# Patient Record
Sex: Female | Born: 1940
Health system: Southern US, Community
[De-identification: ages and names within clinical notes are randomized; demographics above are authoritative.]

## PROBLEM LIST (undated history)

## (undated) DIAGNOSIS — E785 Hyperlipidemia, unspecified: Secondary | ICD-10-CM

## (undated) DIAGNOSIS — Z9889 Other specified postprocedural states: Secondary | ICD-10-CM

## (undated) DIAGNOSIS — I10 Essential (primary) hypertension: Secondary | ICD-10-CM

## (undated) DIAGNOSIS — M1990 Unspecified osteoarthritis, unspecified site: Secondary | ICD-10-CM

## (undated) DIAGNOSIS — R112 Nausea with vomiting, unspecified: Secondary | ICD-10-CM

## (undated) HISTORY — PX: APPENDECTOMY: SHX54

## (undated) HISTORY — PX: TONSILLECTOMY: SUR1361

## (undated) HISTORY — PX: ABDOMINAL HYSTERECTOMY: SHX81

## (undated) HISTORY — PX: REPLACEMENT TOTAL KNEE: SUR1224

---

## 1999-07-05 ENCOUNTER — Encounter: Admission: RE | Admit: 1999-07-05 | Discharge: 1999-07-05 | Payer: Self-pay | Admitting: Family Medicine

## 1999-07-05 ENCOUNTER — Encounter: Payer: Self-pay | Admitting: Family Medicine

## 2000-07-09 ENCOUNTER — Encounter: Payer: Self-pay | Admitting: Internal Medicine

## 2000-07-09 ENCOUNTER — Encounter: Admission: RE | Admit: 2000-07-09 | Discharge: 2000-07-09 | Payer: Self-pay | Admitting: Internal Medicine

## 2001-04-29 ENCOUNTER — Encounter: Admission: RE | Admit: 2001-04-29 | Discharge: 2001-04-29 | Payer: Self-pay | Admitting: Family Medicine

## 2001-04-29 ENCOUNTER — Encounter: Payer: Self-pay | Admitting: Family Medicine

## 2001-07-11 ENCOUNTER — Encounter: Admission: RE | Admit: 2001-07-11 | Discharge: 2001-07-11 | Payer: Self-pay | Admitting: Internal Medicine

## 2001-07-11 ENCOUNTER — Encounter: Payer: Self-pay | Admitting: Internal Medicine

## 2003-10-25 ENCOUNTER — Encounter: Admission: RE | Admit: 2003-10-25 | Discharge: 2003-10-25 | Payer: Self-pay | Admitting: Family Medicine

## 2004-06-07 ENCOUNTER — Ambulatory Visit (HOSPITAL_COMMUNITY): Admission: RE | Admit: 2004-06-07 | Discharge: 2004-06-07 | Payer: Self-pay | Admitting: Family Medicine

## 2005-09-11 ENCOUNTER — Other Ambulatory Visit: Admission: RE | Admit: 2005-09-11 | Discharge: 2005-09-11 | Payer: Self-pay | Admitting: Family Medicine

## 2007-11-17 ENCOUNTER — Observation Stay (HOSPITAL_COMMUNITY): Admission: EM | Admit: 2007-11-17 | Discharge: 2007-11-18 | Payer: Self-pay | Admitting: Emergency Medicine

## 2010-06-25 ENCOUNTER — Encounter: Admission: RE | Admit: 2010-06-25 | Discharge: 2010-06-25 | Payer: Self-pay | Admitting: Orthopedic Surgery

## 2010-07-12 ENCOUNTER — Ambulatory Visit (HOSPITAL_BASED_OUTPATIENT_CLINIC_OR_DEPARTMENT_OTHER): Admission: RE | Admit: 2010-07-12 | Discharge: 2010-07-12 | Payer: Self-pay | Admitting: Orthopedic Surgery

## 2010-09-16 ENCOUNTER — Encounter: Payer: Self-pay | Admitting: Orthopedic Surgery

## 2010-11-07 LAB — BASIC METABOLIC PANEL
CO2: 35 mEq/L — ABNORMAL HIGH (ref 19–32)
Calcium: 9.4 mg/dL (ref 8.4–10.5)
Chloride: 104 mEq/L (ref 96–112)
Creatinine, Ser: 0.9 mg/dL (ref 0.4–1.2)
GFR calc Af Amer: >60 mL/min (ref >60)
Sodium: 146 mEq/L — ABNORMAL HIGH (ref 135–145)

## 2010-11-07 LAB — POCT HEMOGLOBIN-HEMACUE: Hemoglobin: 15 g/dL (ref 12.0–15.0)

## 2011-01-09 NOTE — Consult Note (Signed)
Diamond White, SAUR NO.:  [id number]   MEDICAL RECORD NO.:  [id number]          PATIENT TYPE:  OBV   LOCATION:  3701                         FACILITY:  [facility]   PHYSICIAN:  Jake Bathe, MD      DATE OF BIRTH:  [birth year]   DATE OF CONSULTATION:  11/17/2007  DATE OF DISCHARGE:                                 CONSULTATION   REFERRING PHYSICIAN:  Kela Millin, M.D.   PRIMARY PHYSICIAN:  Tally Joe, M.D.   REASON FOR CONSULTATION:  Ms. Cydni Reddoch is being seen at the  request of Dr. Suanne Marker for the evaluation of chest pain.   HISTORY OF PRESENT ILLNESS:  A 70 year old female with hypertension,  hyperlipidemia, obesity, who yesterday awoke at 2 a.m. with hurting in  her chest, left breast area with sometimes radiation to the left  shoulder.  This occurred while she got up to go to the bathroom.  As of  note, earlier that day she had done some heavy lifting moving some file  work around.  She took Tylenol, Tums, went back to sleep, sat up in the  chair for while.  When trying to get out of the chair while straining,  her chest pain hurt worse.  It was worse with deep inspiration or cough.  She came here to [facility] following a visit by  Dr. Tally Joe and chest pain had no change with nitroglycerin or  morphine.  She has no recent cough, fevers, shortness of breath, nausea,  vomiting, or breathing problems.   PAST MEDICAL HISTORY:  1. Hypertension.  2. Hyperlipidemia, on Crestor.  3. Hiatal hernia.  4. Left hip surgery, bursitis.  5. Left shoulder surgery.   ALLERGIES:  COMPAZINE caused swelling.   MEDICATIONS:  1. Hydrochlorothiazide 25 mg once a day.  2. Crestor 10 mg once a day.  3. Fish oil.  4. Multivitamin.   She does not aspirin take secondary to easy bruising.   FAMILY HISTORY:  No early family history of coronary artery disease or  sudden death.   SOCIAL HISTORY:  No tobacco use.  No alcohol use.  No  illicit drug use.  She works at a Nurse, adult home.   REVIEW OF SYSTEMS:  Unless explained above, all other 12 review of  systems negative.   PHYSICAL EXAMINATION:  VITAL SIGNS:  Pulse 72, blood pressure 114/56,  respirations 16, and satting 100% on room air.  GENERAL:  Alert and oriented x3, in no acute distress, sitting  comfortably in bed, was talking on telephone earlier.  HEENT:  Eyes:  Well-perfused conjunctiva.  EOMI.  No scleral icterus.  NECK:  Supple.  No lymphadenopathy.  No thyromegaly.  No JVD.  No  carotid bruits.  CARDIOVASCULAR:  Regular rate and rhythm.  No murmurs, rubs, or gallops.  Normal PMI.  LUNGS:  Clear to auscultation bilaterally.  Normal respiratory effort.  ABDOMEN:  Soft and nontender.  Normoactive bowel sounds.  Mild obesity.  No bruits.  EXTREMITIES:  No clubbing, cyanosis, or edema.  Normal distal pulses.  NEUROLOGIC:  Nonfocal.  No  tremors.  SKIN:  Warm, dry, and intact.  No rashes over chest wall.  MUSCULOSKELETAL:  She did have tenderness to palpation over the area of  concerning chest pain.   DATA:  EKG done at Dr. Merita Norton office shows sinus rhythm with  nonspecific ST-T wave changes with subtle less then 1-mm ST depression  diffusely in II, III, F, V2, V3, V4, and V5.  Original EKG here shows  sinus rhythm with nonspecific ST-T wave changes, but no significant ST  depression.  T waves are more upright in lead II and in lead V3.  QTc  was 490 on this EKG.  Prior ECG was 429.  EKGs are 2 hours apart.  First  set of cardiac biomarkers are negative.  D-dimer negative.  Chest x-ray  shows no acute airspace disease with questionable left lower lobe  atelectasis.  Sodium 141, potassium 3.6, creatinine 1.1, and glucose  104.  White blood cell count 7.0, hemoglobin 13.9, hematocrit 41, and  platelet count 162,000.  Chest x-ray was personally viewed.   ASSESSMENT/PLAN:  A 70 year old-female with hypertension,  hyperlipidemia, obesity with chest  pain.  1. Chest pain - given changes with position and recent heavy lifting      likely musculoskeletal in etiology, however given her multitude of      risk factors, we agree  with Dr. Donna Bernard and will watch overnight,      continue cycle cardiac enzymes, and get serial EKGs.  If cardiac      enzymes or biomarkers are normal, we will proceed with stress      testing for further risk stratification.  We would recommend      aspirin and low-dose beta blocker currently.  EKG changes are      subtle and nonspecific, however diffuse ST depression is sometimes      associated with severe coronary artery disease and that is why this      warrants further investigation.  D-dimer is negative.  No shortness      of breath, doubt PE.  2. Hyperlipidemia - continue Crestor and fish oil.  3. Hypertension - continue hydrochlorothiazide, add low-dose beta      blocker.  4. Obesity - encouraged weight loss.   We will follow along with you.      Jake Bathe, MD  Electronically Signed     MCS/MEDQ  D:  11/17/2007  T:  11/18/2007  Job:  [id number]   cc:   Tally Joe, M.D.  Kela Millin, M.D.

## 2011-01-09 NOTE — H&P (Signed)
NAMECARLEI, HUANG             ACCOUNT NO.:  [id number]   MEDICAL RECORD NO.:  [id number]          PATIENT TYPE:  OBV   LOCATION:  1824                         FACILITY:  [facility]   PHYSICIAN:  Kela Millin, M.D.DATE OF BIRTH:  05/19/1941   DATE OF ADMISSION:  11/17/2007  DATE OF DISCHARGE:                              HISTORY & PHYSICAL   PRIMARY CARE PHYSICIAN:  Tally Joe, M.D.   CHIEF COMPLAINT:  Chest pain.   HISTORY OF PRESENT ILLNESS:  The patient is a 70 year old obese white  female with past medical history significant for hypertension,  hyperlipidemia, who presents with the above complaints.  She states that  she woke up at 2:00 a.m. to go to the bathroom and noticed she was  having some chest pain.  She describes the pain as left precordial in  location, constant, 9/10 in intensity at its worst, but improved  slightly to 7/10 in intensity, at the time she was seen in the ER.  No  associated shortness of breath, diaphoresis, also no nausea or vomiting  associated.  She states that the pain is worse with lying flat or  leaning forward.  It is also worse with inspiration.  She admits to a  non-productive cough that she states is longstanding, and she attributes  to reflux.  She admits to lifting heavy weights on the day prior to her  symptom onset.  She denies fevers, hemoptysis, dysuria, diarrhea,  melena, no hematochezia.  She also denies dizziness and no palpitations.   The patient was seen in the ER, and her initial EKG showed diffuse, less  than 1-mm ST depression in the inferior lateral leads, and a repeat EKG  showed an improvement in those changes, but point of care markers were  negative.  A chest x-ray was done which showed ? atelectasis at the left  base versus peripheral air space disease.  The pain was not relieved by  nitroglycerin or morphine in the ER.  Her D-dimer within normal limits  at 0.31.  She is admitted for further evaluation and  management.   PAST MEDICAL HISTORY:  As above.   MEDICATIONS:  She thinks she is on hydrochlorothiazide 25 mg.  She is  also on Crestor, 10 mg daily and fish oil and a multivitamin.   ALLERGIES:  DOXYCYCLINE AND COMPAZINE.   SOCIAL HISTORY:  She denies tobacco (second hand smoke from her  husband), she denies alcohol.   FAMILY HISTORY:  Her father is deceased at age 60, he had congestive  heart failure.   REVIEW OF SYSTEMS:  As per HPI, other review of systems negative.   PHYSICAL EXAM:  GENERAL:  In general, the patient is an obese, elderly  white female in no apparent distress.  VITAL SIGNS:  Her temperature is 98.1, blood pressure 104/57, initially  112/53, pulse is 72 with a respiratory rate of 17, O2 sat of 97%.  HEENT:  PERRL, EOMI, sclerae anicteric, moist mucous membranes and no  oral exudates.  NECK:  Supple, no adenopathy, no thyromegaly.  No JVD.  LUNGS:  Decreased breath sounds at the bases,  otherwise clear to  auscultation.  CARDIOVASCULAR:  Regular rate and rhythm, normal S1-S2, she has chest  wall tenderness on the left.  ABDOMEN:  Soft, bowel sounds present, nontender, nondistended.  No  organomegaly and no masses palpable.  EXTREMITIES:  No cyanosis and no edema.   LABORATORY DATA:  As per HPI, also her white cell count is 7.0 with a  hemoglobin of 13.9, hematocrit of 41, platelet count of 162, neutrophil  count 69%, sodium is 141, potassium 3.6, chloride 102, BUN of 17,  creatinine is 1.1, glucose is 104, and urinalysis is negative for  infection.  Her D-dimer is 0.31.  Her chest x-ray shows left basilar  atelectasis versus peripheral air space disease.   ASSESSMENT AND PLAN:  Problem:  1. Chest pain - More likely musculoskeletal versus probable pneumonia,      but given her multiple risk factors as well as the abnormal EKG,      will admit to rule out a myocardial infarction.  I have also      consulted cardiology for further recommendations.  Will  obtain      serial cardiac enzymes and repeat EKG in the a.m.  Will place the      patient on aspirin, beta blockers, nitroglycerin p.r.n.  Will also      add a PPI to cover for possible GI etiology and p.r.n. Toradol to      cover for possible musculoskeletal.  I started the patient on      empiric antibiotics with possible pneumonia.  2. Probable left lower lobe pneumonia - As discussed above, empiric      antibiotics and follow.  3. ? Gastroesophageal reflux disease - PPI as above and follow.  4. Hypertension - Monitor and treat as appropriate, follow and resume      outpatient medications.  5. History of hyperlipidemia - Continue outpatient medications.      Kela Millin, M.D.  Electronically Signed     ACV/MEDQ  D:  11/17/2007  T:  11/17/2007  Job:  [id number]   cc:   Tally Joe, M.D.

## 2011-01-17 ENCOUNTER — Other Ambulatory Visit: Payer: Self-pay | Admitting: Orthopedic Surgery

## 2011-01-17 DIAGNOSIS — M25562 Pain in left knee: Secondary | ICD-10-CM

## 2011-01-24 ENCOUNTER — Other Ambulatory Visit: Payer: Self-pay

## 2011-01-27 ENCOUNTER — Ambulatory Visit
Admission: RE | Admit: 2011-01-27 | Discharge: 2011-01-27 | Disposition: A | Discharge status: Home / Self Care | Payer: No Typology Code available for payment source | Source: Ambulatory Visit | Attending: Orthopedic Surgery | Admitting: Orthopedic Surgery

## 2011-01-27 DIAGNOSIS — M25562 Pain in left knee: Secondary | ICD-10-CM

## 2011-03-19 ENCOUNTER — Other Ambulatory Visit: Payer: Self-pay | Admitting: Orthopedic Surgery

## 2011-03-19 DIAGNOSIS — M545 Low back pain: Secondary | ICD-10-CM

## 2011-03-22 ENCOUNTER — Ambulatory Visit
Admission: RE | Admit: 2011-03-22 | Discharge: 2011-03-22 | Disposition: A | Discharge status: Home / Self Care | Payer: Medicare Other | Source: Ambulatory Visit | Attending: Orthopedic Surgery | Admitting: Orthopedic Surgery

## 2011-03-22 DIAGNOSIS — M545 Low back pain: Secondary | ICD-10-CM

## 2011-04-19 ENCOUNTER — Ambulatory Visit (HOSPITAL_COMMUNITY)
Admission: RE | Admit: 2011-04-19 | Discharge: 2011-04-19 | Disposition: A | Discharge status: Home / Self Care | Payer: Medicare Other | Source: Ambulatory Visit | Attending: Orthopedic Surgery | Admitting: Orthopedic Surgery

## 2011-04-19 ENCOUNTER — Encounter (HOSPITAL_COMMUNITY)
Admission: RE | Admit: 2011-04-19 | Discharge: 2011-04-19 | Disposition: A | Discharge status: Home / Self Care | Payer: Medicare Other | Source: Ambulatory Visit | Attending: Orthopedic Surgery | Admitting: Orthopedic Surgery

## 2011-04-19 ENCOUNTER — Other Ambulatory Visit (HOSPITAL_COMMUNITY): Payer: Self-pay | Admitting: Orthopedic Surgery

## 2011-04-19 DIAGNOSIS — I10 Essential (primary) hypertension: Secondary | ICD-10-CM | POA: Insufficient documentation

## 2011-04-19 DIAGNOSIS — Z01811 Encounter for preprocedural respiratory examination: Secondary | ICD-10-CM | POA: Insufficient documentation

## 2011-04-19 DIAGNOSIS — IMO0002 Reserved for concepts with insufficient information to code with codable children: Secondary | ICD-10-CM | POA: Insufficient documentation

## 2011-04-19 DIAGNOSIS — Z01812 Encounter for preprocedural laboratory examination: Secondary | ICD-10-CM | POA: Insufficient documentation

## 2011-04-19 DIAGNOSIS — M1712 Unilateral primary osteoarthritis, left knee: Secondary | ICD-10-CM

## 2011-04-19 DIAGNOSIS — M1710 Unilateral primary osteoarthritis, unspecified knee: Secondary | ICD-10-CM | POA: Insufficient documentation

## 2011-04-19 LAB — URINALYSIS, ROUTINE W REFLEX MICROSCOPIC
Glucose, UA: NEGATIVE mg/dL
Leukocytes, UA: NEGATIVE
Nitrite: NEGATIVE
Specific Gravity, Urine: 1.019 (ref 1.005–1.030)
pH: 5 (ref 5.0–8.0)

## 2011-04-19 LAB — ABO/RH: ABO/RH(D): O POS

## 2011-04-19 LAB — DIFFERENTIAL
Basophils Relative: 0 % (ref 0–1)
Eosinophils Absolute: 0.2 10*3/uL (ref 0.0–0.7)
Monocytes Relative: 9 % (ref 3–12)
Neutro Abs: 5.2 10*3/uL (ref 1.7–7.7)
Neutrophils Relative %: 63 % (ref 43–77)

## 2011-04-19 LAB — COMPREHENSIVE METABOLIC PANEL
ALT: 17 U/L (ref 0–35)
AST: 17 U/L (ref 0–37)
Albumin: 3.5 g/dL (ref 3.5–5.2)
Alkaline Phosphatase: 78 U/L (ref 39–117)
CO2: 31 mEq/L (ref 19–32)
Chloride: 103 mEq/L (ref 96–112)
Creatinine, Ser: 0.74 mg/dL (ref 0.50–1.10)
Potassium: 3.6 mEq/L (ref 3.5–5.1)
Sodium: 143 mEq/L (ref 135–145)
Total Bilirubin: 0.5 mg/dL (ref 0.3–1.2)

## 2011-04-19 LAB — CBC
Hemoglobin: 14.4 g/dL (ref 12.0–15.0)
Platelets: 158 10*3/uL (ref 150–400)
RBC: 4.57 MIL/uL (ref 3.87–5.11)
WBC: 8.3 10*3/uL (ref 4.0–10.5)

## 2011-04-19 LAB — TYPE AND SCREEN: Antibody Screen: NEGATIVE

## 2011-04-19 LAB — PROTIME-INR: INR: 0.9 (ref 0.00–1.49)

## 2011-04-19 LAB — SURGICAL PCR SCREEN: MRSA, PCR: NEGATIVE

## 2011-04-23 ENCOUNTER — Inpatient Hospital Stay (HOSPITAL_COMMUNITY)
Admission: RE | Admit: 2011-04-23 | Discharge: 2011-04-26 | DRG: 470 | Disposition: A | Discharge status: Home Health | Payer: Medicare Other | Source: Ambulatory Visit | Attending: Orthopedic Surgery | Admitting: Orthopedic Surgery

## 2011-04-23 DIAGNOSIS — M1710 Unilateral primary osteoarthritis, unspecified knee: Principal | ICD-10-CM | POA: Diagnosis present

## 2011-04-23 DIAGNOSIS — E785 Hyperlipidemia, unspecified: Secondary | ICD-10-CM | POA: Diagnosis present

## 2011-04-23 DIAGNOSIS — E669 Obesity, unspecified: Secondary | ICD-10-CM | POA: Diagnosis present

## 2011-04-23 DIAGNOSIS — I10 Essential (primary) hypertension: Secondary | ICD-10-CM | POA: Diagnosis present

## 2011-04-24 LAB — URINALYSIS, ROUTINE W REFLEX MICROSCOPIC
Glucose, UA: NEGATIVE mg/dL
Hgb urine dipstick: NEGATIVE
Ketones, ur: NEGATIVE mg/dL
Protein, ur: NEGATIVE mg/dL

## 2011-04-24 LAB — CBC
HCT: 35.8 % — ABNORMAL LOW (ref 36.0–46.0)
MCHC: 32.7 g/dL (ref 30.0–36.0)
Platelets: 126 10*3/uL — ABNORMAL LOW (ref 150–400)
RDW: 13.1 % (ref 11.5–15.5)

## 2011-04-24 LAB — BASIC METABOLIC PANEL
CO2: 30 mEq/L (ref 19–32)
Calcium: 8 mg/dL — ABNORMAL LOW (ref 8.4–10.5)
Creatinine, Ser: 0.69 mg/dL (ref 0.50–1.10)
Glucose, Bld: 127 mg/dL — ABNORMAL HIGH (ref 70–99)

## 2011-04-24 LAB — PROTIME-INR: INR: 1.02 (ref 0.00–1.49)

## 2011-04-25 LAB — CBC
HCT: 33.3 % — ABNORMAL LOW (ref 36.0–46.0)
MCH: 32.2 pg (ref 26.0–34.0)
MCHC: 34.2 g/dL (ref 30.0–36.0)
MCV: 94.1 fL (ref 78.0–100.0)
RDW: 13.1 % (ref 11.5–15.5)

## 2011-04-26 LAB — CBC
HCT: 34.6 % — ABNORMAL LOW (ref 36.0–46.0)
MCH: 31.1 pg (ref 26.0–34.0)
MCHC: 33.2 g/dL (ref 30.0–36.0)
MCV: 93.5 fL (ref 78.0–100.0)
RDW: 13 % (ref 11.5–15.5)

## 2011-05-03 NOTE — Op Note (Addendum)
NAMEMELENA, HAYES NO.:  [id number]  MEDICAL RECORD NO.:  [id number]  LOCATION:  XRAY                         FACILITY:  [facility]  PHYSICIAN:  Harvie Junior, M.D.   DATE OF BIRTH:  [birth year]  DATE OF PROCEDURE:  04/23/2011 DATE OF DISCHARGE:  04/19/2011                              OPERATIVE REPORT   PREOPERATIVE DIAGNOSIS:  End-stage degenerative joint disease, left knee with valgus malalignment.  POSTOPERATIVE DIAGNOSIS:  End-stage degenerative joint disease, left knee with valgus malalignment.  PRINCIPAL PROCEDURE: 1. The left total knee replacement with a Sigma system size 3 femur,     size 2.5 tibia, 10-mm bridging bearing and a 35-mm all-polyethylene     patella 2. Computer-assisted left total knee replacement. 3. Lateral retinacular release.  SURGEON:  Harvie Junior, MD  ASSISTANT:  Marshia Ly, PA.  HISTORY:  Ms. Magnus Ivan is a 70 year old female with long history having had significant complaints of left knee pain.  She had been treated conservatively for prolonged period time, activity modification, use of a cane, arthroscopy injection therapy, viscous supplementation because she failed all of these conservative measures over a period of greater than years.  The patient was also taken to the operating for left total knee replacement.  Because of her valgus malalignment, computer system was chosen to be used preoperatively.  PROCEDURE:  The patient was taken to the operating room.  After adequate anesthesia was obtained under general anesthetic, the patient was placed supine on the operating room table.  The left leg was then prepped and draped in usual sterile fashion.  Following this, the leg was exsanguinated.  Blood pressure tourniquet was inflated to 350 mmHg. Following this, a midline incision was made through subcutaneous tissue down the level of extensor mechanism.  Medial parapatellar arthrotomy was undertaken.  The medial and  lateral meniscus was then removed as well as anterior posterior cruciates, retropatellar fat pad and the synovium in the anterior aspect of the femur.  Attention was then turned towards computer assistance were 2 pins were placed in the tibia, 2 pins in the femur and then the rays were placed in the knee, registration process is undertaken.  This adds about a half an hour to the surgical procedure.  Once this was completed, the tibia was then cut perpendicular to its long axis.  The femur was then cut perpendicular to its anatomic axis and spacer blocks were put in place to get easy perfect neutral long alignment and gap balance.  Attention was turned to the femur, which was sized to a size 3 and anterior-posterior cuts were made as well as chamfers and box.  Attention was turned to the tibia, sized to 2.5, it was drilled and keeled.  Once this was done, the trial components were put in place, 10-mm bridging bearing and computer assistance gave [place] perfect neutral long alignment and gap balance. Attention was turned to the patella, which was drilled to a level of 3 pegs were drilled for 35 patella.  Patellar button was chosen and put through a range of motion.  There was some lateral patellar tracking at this point with patellar pulling to the lateral side.  At that  point, a lateral release was performed with a Bovie and this made the patellar tracking midline at this point.  Once this was completed, the trial components were all removed.  The knee was copious and thoroughly lavaged and suctioned dry, and dried with a sponge.  Final components were cemented into place, size 2.5 tibia, size 3 femur, 10-mm bridging bearing and a 35-mm all-polyethylene patella.  Once this was put into place, the cement was allowed to harden.  Excess cement was removed the cement tool.  Once that was completed, then the tourniquet was let down. All bleeders were controlled electrocautery.  The final  polyethylene size 10 was put in place and the knee put through a range of motion again in perfect patellar tracking.  At this point the knee was copious and thoroughly lavaged, suctioned dry.  I closed with 1 Vicryl running and the medial parapatellar arthrotomy and 2-0 Vicryl and 3-0 Monocryl subcuticular.  Benzoin, Steri-Strips was applied.  Sterile compressive dressing was applied.  The patient was taken to recovery room, was noted to be in satisfactory condition.  Estimated blood loss for this procedure less than 50 mL.     Harvie Junior, M.D.     Ranae Plumber  D:  04/23/2011  T:  04/24/2011  Job:  [id number]  Electronically Signed by Jodi Geralds M.D. on 05/15/2011 12:47:51 PM

## 2011-05-21 LAB — CBC
Hemoglobin: 13.9
RBC: 4.42
RDW: 13.1

## 2011-05-21 LAB — URINE MICROSCOPIC-ADD ON

## 2011-05-21 LAB — POCT CARDIAC MARKERS
Myoglobin, poc: 63.9
Operator id: 151321

## 2011-05-21 LAB — POCT I-STAT, CHEM 8
BUN: 17
Calcium, Ion: 1.14
Chloride: 102
Potassium: 3.6

## 2011-05-21 LAB — DIFFERENTIAL
Basophils Absolute: 0
Lymphocytes Relative: 20
Monocytes Absolute: 0.6
Neutro Abs: 4.8
Neutrophils Relative %: 69

## 2011-05-21 LAB — URINALYSIS, ROUTINE W REFLEX MICROSCOPIC
Glucose, UA: NEGATIVE
Hgb urine dipstick: NEGATIVE
Specific Gravity, Urine: 1.03
pH: 5

## 2011-05-21 LAB — CK TOTAL AND CKMB (NOT AT ARMC): CK, MB: 1.2

## 2011-05-21 LAB — CARDIAC PANEL(CRET KIN+CKTOT+MB+TROPI)
CK, MB: 1.3
Relative Index: 1.2
Total CK: 108

## 2011-05-22 NOTE — Discharge Summary (Signed)
NAMEZARI, CLY             ACCOUNT NO.:  [id number]  MEDICAL RECORD NO.:  [id number]  LOCATION:                                 FACILITY:  PHYSICIAN:  Harvie Junior, M.D.   DATE OF BIRTH:  03-28-1941  DATE OF ADMISSION:  04/23/2011 DATE OF DISCHARGE:  04/26/2011                              DISCHARGE SUMMARY   ADMITTING DIAGNOSES: 1. End-stage degenerative joint disease, left knee. 2. Chronic osteoarthritis. 3. Hypertension. 4. Hyperlipidemia.  DISCHARGE DIAGNOSES: 1. End-stage degenerative joint disease, left knee. 2. Chronic osteoarthritis. 3. Hypertension. 4. Hyperlipidemia.  PROCEDURES IN HOSPITAL:  Left total knee arthroplasty, computer assisted, Jodi Geralds, MD, April 23, 2011.  BRIEF HISTORY:  Diamond White is a 70 year old patient who has been treated in our practice for many years.  She has a long history of left knee pain.  She has pain with ambulation, positive night pain in her left knee.  She had greater than 6 months duration of pain.  She did not get improvement with the use of assisted devices.  She had x-rays that again showed severe bone-on-bone degenerative arthritis and she got no relief with exhaustive conservative treatment including injection therapy, physical therapy, and modification of activity.  Based upon her clinical and radiographic findings, she is felt to be a candidate for a left total knee replacement and is admitted for this.  PERTINENT LABORATORY STUDIES:  Her hemoglobin on admission was 14.0, hematocrit of 43, potassium was 3.6.  On postop day #1, her hemoglobin was 11.7, #2 was 11.4, hemoglobin was 11.5 on postop day #3.  Platelet count was 128,000.  Her protime on postop day #1 was 13.6 seconds with an INR of 1.02.  On the date of discharge on Coumadin therapy per pharmacy, her protime was 36.4 seconds with an INR of 3.59.  BMET was within normal limits with a glucose of 127, potassium of 3.6.  Sodium of 139.  EGFR was  greater than 60.  Urinalysis showed no abnormalities preoperatively.  HOSPITAL COURSE:  The patient underwent left total knee arthroplasty, computer assisted.  It is well described in Dr. Luiz Blare' operative note. Preoperatively, she was given a gram of Ancef and 80 mg IV of gentamicin.  Postoperatively, she was given a gram of Ancef q.8 h. x24 hours.  She was started on Coumadin, antithrombotic therapy per pharmacy protocol for DVT prophylaxis.  PCA morphine pump was used for pain control.  Physical therapy was ordered for walker ambulation, weightbearing as tolerated on the left.  CPM machine was used for knee range of motion.  On postoperative day #1, she had complaints of left knee pain.  She was taking fluids without difficulty.  Her Foley catheter was removed.  Her vital signs were stable.  Her O2 sats were 98% on 2 L of oxygen.  Her hemoglobin was 11.7.  Her BMET was within normal range.  Her INR was 1.07.  Her Foley catheter was removed.  She was continued on the PCA morphine pump.  On postoperative day #2, her left knee drain was removed.  Her dressing was changed.  Her IV was converted to a saline lock and her PCA morphine pump was discontinued.  She continued with physical therapy.  On postop day #3, she had moderate left knee pain.  She stated she was ready to go home.  She was taking fluids and voiding without difficulty.  She was progressing with physical therapy.  She had low-grade fever of 100.0 and was then found to be afebrile.  Her vital signs were stable.  Her O2 sats were 93% on room air.  Hemoglobin was 11.5.  INR was 3.59.  Her left knee dressing was clean and dry and neurovascular status was intact in her left lower extremity.  She had her saline lock discontinued.  She was discharged home after physical therapy.  She will hold her Coumadin until further notified by the Home Health Pharmacy.  She will need home health physical therapy 3 times a week and home CPM  machine for left knee range of motion.  CONDITION ON DISCHARGE:  Improved.  DIET ON DISCHARGE:  Regular.  Medications at discharge will include: 1. Alprazolam 0.25 mg 1.2-1 tablet twice daily as needed. 2. Hydrochlorothiazide 25 mg 1 daily. 3. Calcium OTC 1 daily. 4. Fish oil 1 daily. 5. Crestor 10 mg 1 daily. 6. Vicodin 5 mg 1 or 2 every 4-6 hours as needed for pain. 7. Robaxin 750 mg 1 every 8 hours as needed for spasm. 8. Coumadin as directed per pharmacy x1 month postop.  She will need to keep her left knee wound dry until further notified. She will follow up with Dr. Luiz Blare in 10-14 days in the office, sooner should any problems occur.     Marshia Ly, P.A.   ______________________________ Harvie Junior, M.D.    JB/MEDQ  D:  05/10/2011  T:  05/10/2011  Job:  [id number]  cc:   Tally Joe, M.D.  Electronically Signed by Marshia Ly P.A. on 05/16/2011 12:47:47 PM Electronically Signed by Jodi Geralds M.D. on 05/22/2011 05:21:50 PM

## 2011-06-22 ENCOUNTER — Emergency Department (HOSPITAL_COMMUNITY)
Admission: EM | Admit: 2011-06-22 | Discharge: 2011-06-22 | Disposition: A | Discharge status: Home / Self Care | Payer: Medicare Other | Attending: Emergency Medicine | Admitting: Emergency Medicine

## 2011-06-22 DIAGNOSIS — E7800 Pure hypercholesterolemia, unspecified: Secondary | ICD-10-CM | POA: Insufficient documentation

## 2011-06-22 DIAGNOSIS — I10 Essential (primary) hypertension: Secondary | ICD-10-CM | POA: Insufficient documentation

## 2011-06-22 DIAGNOSIS — K5289 Other specified noninfective gastroenteritis and colitis: Secondary | ICD-10-CM | POA: Insufficient documentation

## 2011-06-22 LAB — BASIC METABOLIC PANEL
GFR calc Af Amer: >90 mL/min (ref >90)
GFR calc non Af Amer: 89 mL/min — ABNORMAL LOW (ref >90)
Glucose, Bld: 133 mg/dL — ABNORMAL HIGH (ref 70–99)
Potassium: 2.8 mEq/L — ABNORMAL LOW (ref 3.5–5.1)
Sodium: 137 mEq/L (ref 135–145)

## 2011-06-22 LAB — DIFFERENTIAL
Basophils Absolute: 0 10*3/uL (ref 0.0–0.1)
Basophils Relative: 0 % (ref 0–1)
Monocytes Absolute: 0.9 10*3/uL (ref 0.1–1.0)
Neutro Abs: 7 10*3/uL (ref 1.7–7.7)
Neutrophils Relative %: 72 % (ref 43–77)

## 2011-06-22 LAB — CBC
Hemoglobin: 14.3 g/dL (ref 12.0–15.0)
MCHC: 33.2 g/dL (ref 30.0–36.0)
WBC: 9.7 10*3/uL (ref 4.0–10.5)

## 2012-03-04 ENCOUNTER — Other Ambulatory Visit: Payer: Self-pay | Admitting: Orthopedic Surgery

## 2012-03-04 DIAGNOSIS — M79672 Pain in left foot: Secondary | ICD-10-CM

## 2012-03-10 ENCOUNTER — Ambulatory Visit
Admission: RE | Admit: 2012-03-10 | Discharge: 2012-03-10 | Disposition: A | Discharge status: Home / Self Care | Payer: Medicare Other | Source: Ambulatory Visit | Attending: Orthopedic Surgery | Admitting: Orthopedic Surgery

## 2012-03-10 DIAGNOSIS — M79672 Pain in left foot: Secondary | ICD-10-CM

## 2012-10-09 IMAGING — CR DG CHEST 2V
2 series · 2 of 2 positions shown · non-contrast
Comparison: November 17, 2007

CLINICAL DATA: Preoperative respiratory exam; left total knee
replacement; hypertension

CHEST - 2 VIEW

[view not recorded (1 of 2)]
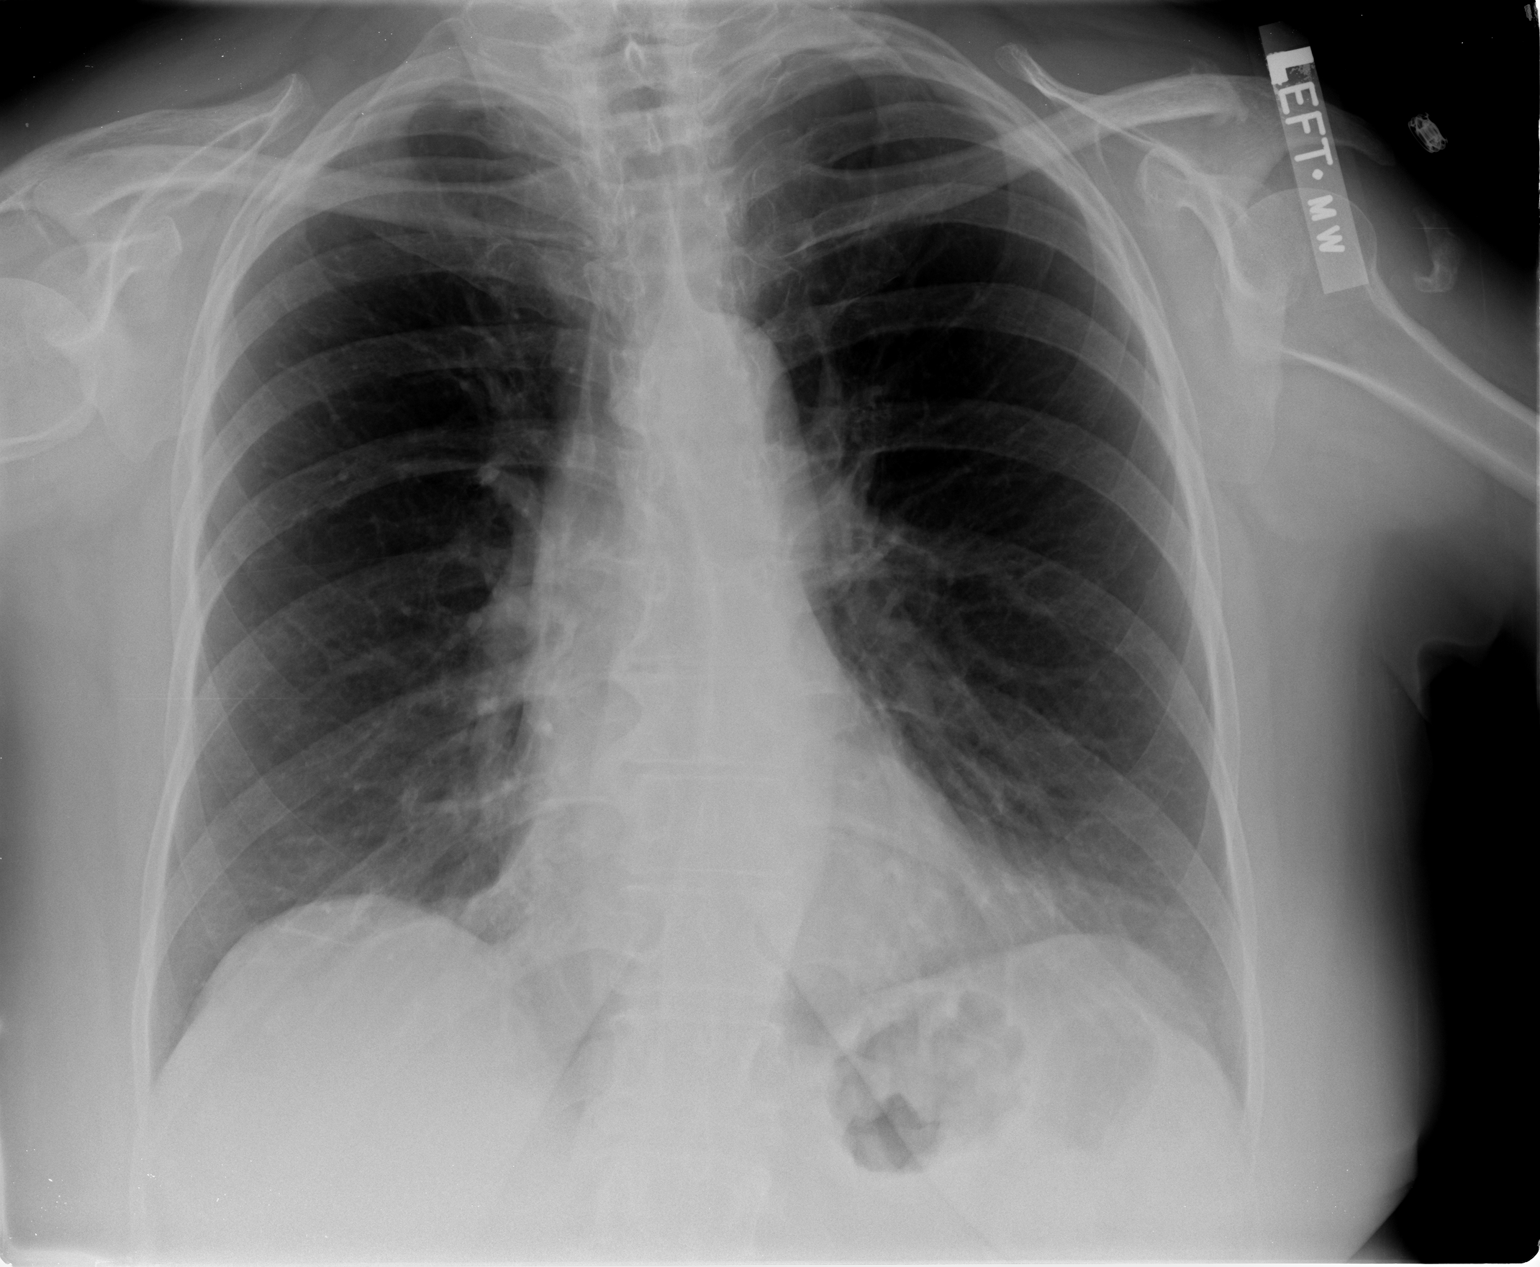

[view not recorded (2 of 2)]
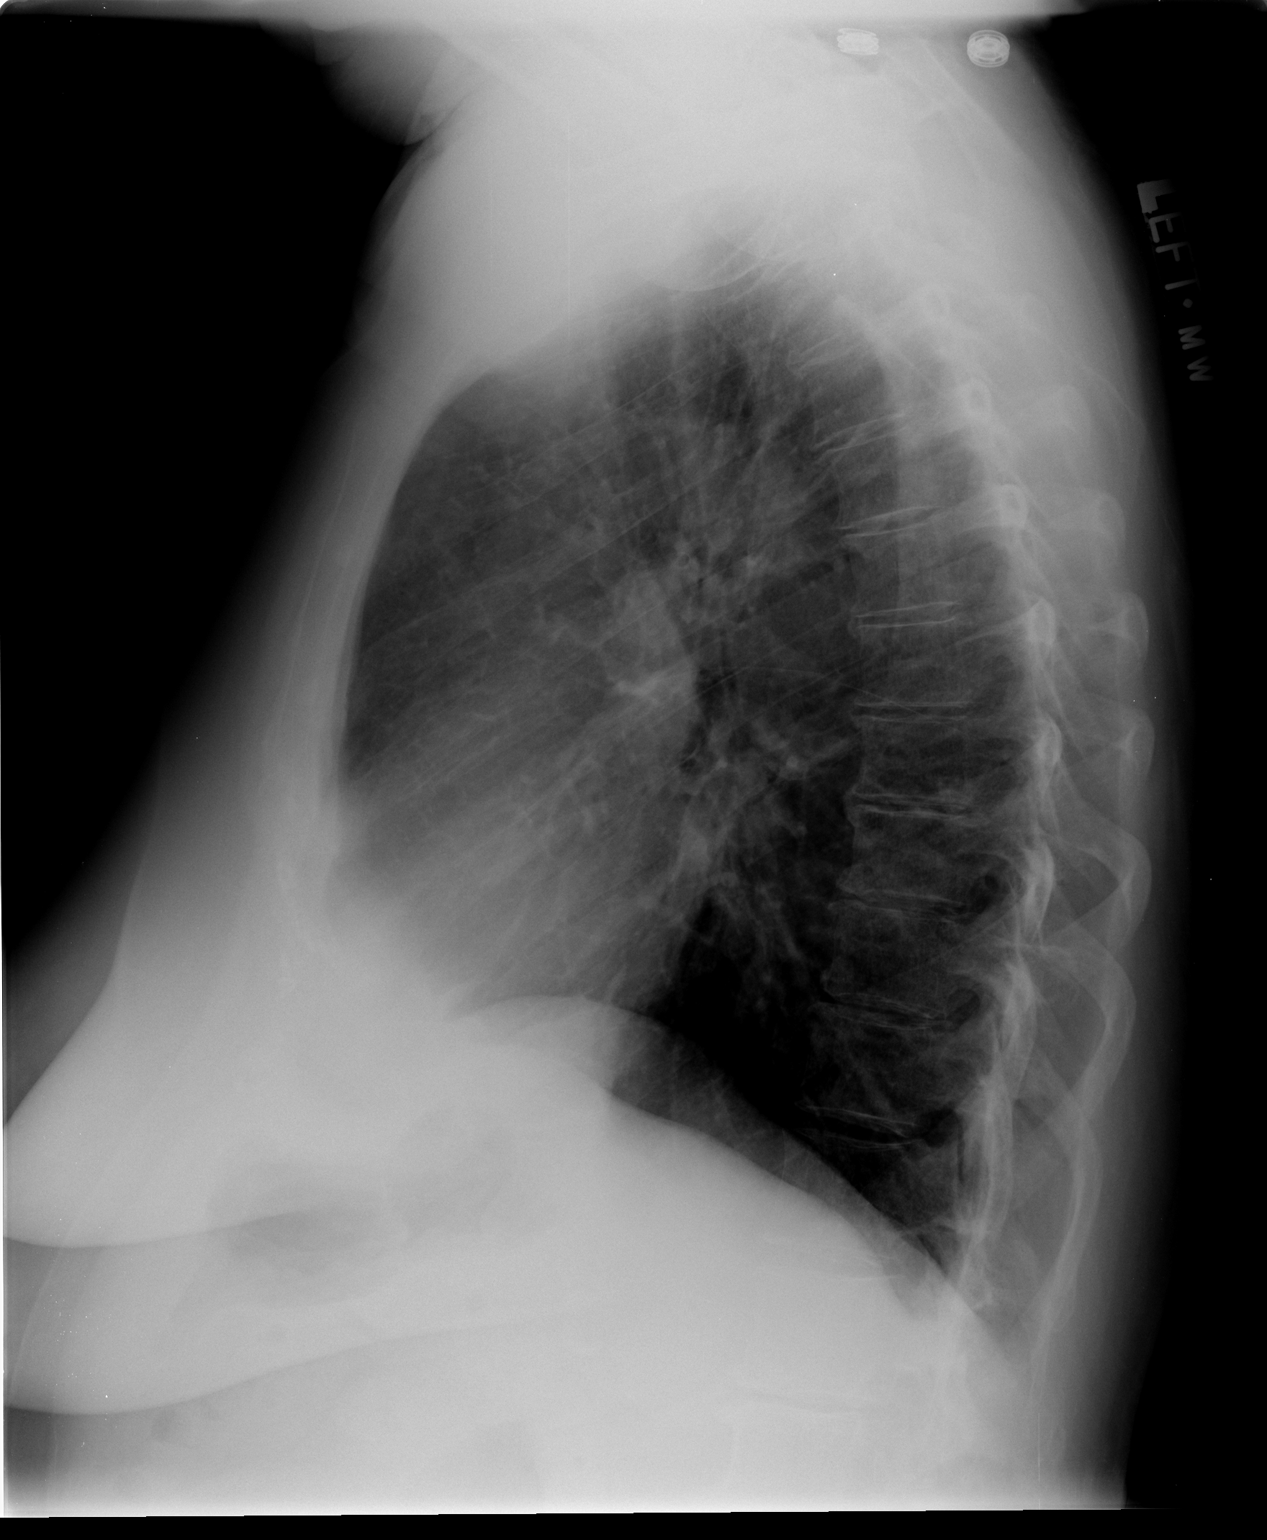

[2 of 2 positions shown; findings below may reference images not displayed]

FINDINGS: The cardiac silhouette, mediastinum, pulmonary
vasculature are within normal limits.  Both lungs are clear.
There is no acute bony abnormality.
IMPRESSION: There is no evidence of acute cardiac or pulmonary process.

## 2014-09-13 ENCOUNTER — Other Ambulatory Visit: Payer: Self-pay | Admitting: Orthopedic Surgery

## 2014-09-13 ENCOUNTER — Ambulatory Visit
Admission: RE | Admit: 2014-09-13 | Discharge: 2014-09-13 | Disposition: A | Discharge status: Home / Self Care | Payer: Medicare Other | Source: Ambulatory Visit | Attending: Orthopedic Surgery | Admitting: Orthopedic Surgery

## 2014-09-13 DIAGNOSIS — M25561 Pain in right knee: Secondary | ICD-10-CM

## 2014-09-23 ENCOUNTER — Other Ambulatory Visit: Payer: Medicare Other

## 2014-11-13 ENCOUNTER — Encounter (HOSPITAL_COMMUNITY): Payer: Self-pay | Admitting: Emergency Medicine

## 2014-11-13 ENCOUNTER — Emergency Department (HOSPITAL_COMMUNITY)
Admission: EM | Admit: 2014-11-13 | Discharge: 2014-11-14 | Disposition: A | Discharge status: Home / Self Care | Payer: Medicare Other | Attending: Emergency Medicine | Admitting: Emergency Medicine

## 2014-11-13 DIAGNOSIS — Z9049 Acquired absence of other specified parts of digestive tract: Secondary | ICD-10-CM | POA: Diagnosis not present

## 2014-11-13 DIAGNOSIS — R197 Diarrhea, unspecified: Secondary | ICD-10-CM | POA: Insufficient documentation

## 2014-11-13 DIAGNOSIS — F419 Anxiety disorder, unspecified: Secondary | ICD-10-CM | POA: Diagnosis not present

## 2014-11-13 DIAGNOSIS — Z90710 Acquired absence of both cervix and uterus: Secondary | ICD-10-CM | POA: Insufficient documentation

## 2014-11-13 DIAGNOSIS — I10 Essential (primary) hypertension: Secondary | ICD-10-CM | POA: Diagnosis not present

## 2014-11-13 DIAGNOSIS — R110 Nausea: Secondary | ICD-10-CM | POA: Diagnosis not present

## 2014-11-13 DIAGNOSIS — R1084 Generalized abdominal pain: Secondary | ICD-10-CM | POA: Diagnosis not present

## 2014-11-13 DIAGNOSIS — Z8739 Personal history of other diseases of the musculoskeletal system and connective tissue: Secondary | ICD-10-CM | POA: Diagnosis not present

## 2014-11-13 HISTORY — DX: Unspecified osteoarthritis, unspecified site: M19.90

## 2014-11-13 HISTORY — DX: Essential (primary) hypertension: I10

## 2014-11-13 LAB — COMPREHENSIVE METABOLIC PANEL
ALT: 18 U/L (ref 0–35)
AST: 22 U/L (ref 0–37)
Albumin: 3.9 g/dL (ref 3.5–5.2)
Alkaline Phosphatase: 78 U/L (ref 39–117)
Anion gap: 13 (ref 5–15)
BUN: 14 mg/dL (ref 6–23)
CO2: 23 mmol/L (ref 19–32)
Calcium: 9.1 mg/dL (ref 8.4–10.5)
Chloride: 106 mmol/L (ref 96–112)
Creatinine, Ser: 0.68 mg/dL (ref 0.50–1.10)
GFR calc Af Amer: >90 mL/min (ref >90)
GFR calc non Af Amer: 86 mL/min — ABNORMAL LOW (ref >90)
Glucose, Bld: 174 mg/dL — ABNORMAL HIGH (ref 70–99)
Potassium: 4 mmol/L (ref 3.5–5.1)
Sodium: 142 mmol/L (ref 135–145)
Total Bilirubin: 0.7 mg/dL (ref 0.3–1.2)
Total Protein: 6.7 g/dL (ref 6.0–8.3)

## 2014-11-13 LAB — CBC WITH DIFFERENTIAL/PLATELET
Basophils Absolute: 0 10*3/uL (ref 0.0–0.1)
Basophils Relative: 0 % (ref 0–1)
Eosinophils Absolute: 0 10*3/uL (ref 0.0–0.7)
Eosinophils Relative: 0 % (ref 0–5)
HCT: 43.8 % (ref 36.0–46.0)
Hemoglobin: 14.4 g/dL (ref 12.0–15.0)
Lymphocytes Relative: 10 % — ABNORMAL LOW (ref 12–46)
Lymphs Abs: 0.8 10*3/uL (ref 0.7–4.0)
MCH: 31.5 pg (ref 26.0–34.0)
MCHC: 32.9 g/dL (ref 30.0–36.0)
MCV: 95.8 fL (ref 78.0–100.0)
Monocytes Absolute: 0.4 10*3/uL (ref 0.1–1.0)
Monocytes Relative: 5 % (ref 3–12)
Neutro Abs: 6.9 10*3/uL (ref 1.7–7.7)
Neutrophils Relative %: 85 % — ABNORMAL HIGH (ref 43–77)
Platelets: 182 10*3/uL (ref 150–400)
RBC: 4.57 MIL/uL (ref 3.87–5.11)
RDW: 12.7 % (ref 11.5–15.5)
WBC: 8.1 10*3/uL (ref 4.0–10.5)

## 2014-11-13 LAB — LIPASE, BLOOD: Lipase: 44 U/L (ref 11–59)

## 2014-11-13 MED ORDER — SODIUM CHLORIDE 0.9 % IV BOLUS (SEPSIS)
1000.0000 mL | Freq: Once | INTRAVENOUS | Status: AC
  Administered 2014-11-13: 1000 mL via INTRAVENOUS

## 2014-11-13 MED ORDER — ONDANSETRON HCL 4 MG/2ML IJ SOLN
4.0000 mg | Freq: Once | INTRAMUSCULAR | Status: AC
  Administered 2014-11-13: 4 mg via INTRAVENOUS
  Filled 2014-11-13: qty 2

## 2014-11-13 MED ORDER — LORAZEPAM 2 MG/ML IJ SOLN
0.5000 mg | Freq: Once | INTRAMUSCULAR | Status: AC
  Administered 2014-11-13: 0.5 mg via INTRAVENOUS
  Filled 2014-11-13: qty 1

## 2014-11-13 NOTE — ED Notes (Signed)
Pt reports diarrhea yesterday and nausea, abdominal pain (generalized) since this morning. Patient is not actually vomiting in triage but is dry heaving and having clear phlegm. Denies fever. No other c/c.

## 2014-11-13 NOTE — ED Provider Notes (Signed)
CSN: [id number]     Arrival date & time 11/13/14  2142 History   First MD Initiated Contact with Patient 11/13/14 2227     Chief Complaint  Patient presents with  . Diarrhea  . Nausea  . Abdominal Pain     (Consider location/radiation/quality/duration/timing/severity/associated sxs/prior Treatment) Patient is a 74 y.o. female presenting with diarrhea and abdominal pain. The history is provided by the patient and medical records.  Diarrhea Associated symptoms: abdominal pain   Abdominal Pain Associated symptoms: diarrhea and nausea     This is a 74 y.o. F with PMH significant for HTN, arthritis, presenting to the ED for diarrhea and nausea which began earlier this morning.  States she has had a few episodes of watery diarrhea which has improved since initial onset.  She is freely passing flatus.  States she has felt persistently nauseated but has not vomited.  She states she has generalized abdominal pain but cannot characterize the pain.  No noted exacerbating or alleviating factors.  She denies fever, chills, or sweats.  She states she took some OTC nausea medication earlier today without relief.  Did not attempt to eat today, has drank small amounts of fluids.  No recent travel, abx use, or dietary changes.  Prior abdominal surgeries include appendectomy and hysterectomy.  VSS.  Past Medical History  Diagnosis Date  . Hypertension   . Arthritis    Past Surgical History  Procedure Laterality Date  . Appendectomy    . Replacement total knee    . Tonsillectomy    . Abdominal hysterectomy     History reviewed. No pertinent family history. History  Substance Use Topics  . Smoking status: Never Smoker   . Smokeless tobacco: Not on file  . Alcohol Use: No   OB History    No data available     Review of Systems  Gastrointestinal: Positive for nausea, abdominal pain and diarrhea.  All other systems reviewed and are negative.     Allergies  Compazine  Home Medications    Prior to Admission medications   Not on File   BP 152/92 mmHg  Pulse 85  Temp(Src) 97.6 F (36.4 C) (Oral)  Resp 17  SpO2 100%   Physical Exam  Constitutional: She is oriented to person, place, and time. She appears well-developed and well-nourished. No distress.  HENT:  Head: Normocephalic and atraumatic.  Mouth/Throat: Oropharynx is clear and moist.  Mildly dry mucous membranes  Eyes: Conjunctivae and EOM are normal. Pupils are equal, round, and reactive to light.  Neck: Normal range of motion. Neck supple.  Cardiovascular: Normal rate, regular rhythm and normal heart sounds.   Pulmonary/Chest: Effort normal and breath sounds normal. No respiratory distress. She has no wheezes.  Abdominal: Soft. Bowel sounds are normal. There is no tenderness. There is no guarding.  Abdomen soft, non-distended, no focal tenderness or peritoneal signs; no guarding  Musculoskeletal: Normal range of motion.  Neurological: She is alert and oriented to person, place, and time.  Skin: Skin is warm and dry. She is not diaphoretic.  Psychiatric: Her mood appears anxious.  Appears mildly anxious  Nursing note and vitals reviewed.   ED Course  Procedures (including critical care time) Labs Review Labs Reviewed  CBC WITH DIFFERENTIAL/PLATELET - Abnormal; Notable for the following:    Neutrophils Relative % 85 (*)    Lymphocytes Relative 10 (*)    All other components within normal limits  COMPREHENSIVE METABOLIC PANEL - Abnormal; Notable for the following:  Glucose, Bld 174 (*)    GFR calc non Af Amer 86 (*)    All other components within normal limits  URINALYSIS, ROUTINE W REFLEX MICROSCOPIC - Abnormal; Notable for the following:    Ketones, ur 40 (*)    All other components within normal limits  LIPASE, BLOOD    Imaging Review No results found.   EKG Interpretation None      MDM   Final diagnoses:  Diarrhea  Nausea   74 y.o. F with diarrhea and nausea which has improved  throughout the day today.  No sick contacts, travel, dietary changes, or abx use.  Patient afebrile and non-toxic in appearance.  Her abdominal exam is benign without focal tenderness or peritoneal signs.  Mucous membranes mildly dry but does not appear overly dehydrated.  Lab work pending.  Patient given IVF, zofran, ativan.  12:44 AM Lab work is reassuring.  After fluids and meds patient states she is feeling much better.  She has tolerated PO without difficulty.  She has had no diarrhea or vomiting here in the ED.  Her VS remain stable and abdominal exam remains benign.  Patient will be d/c home with supportive care.  Encouraged close PCP FU.  Discussed plan with patient, he/she acknowledged understanding and agreed with plan of care.  Return precautions given for new or worsening symptoms.  Case discussed with attending physician, Dr. Denton Lank, who evaluated patient and agrees with assessment and plan of care.  Garlon Hatchet, PA-C 11/14/14 0107  Cathren Laine, MD 11/14/14 [phone]

## 2014-11-13 NOTE — ED Notes (Signed)
Patient ambulatory from triage without difficulty Patient not actively vomiting--patient spitting up clear saliva and phlegm Patient in NAD

## 2014-11-13 NOTE — ED Notes (Signed)
MD at bedside. 

## 2014-11-14 LAB — URINALYSIS, ROUTINE W REFLEX MICROSCOPIC
BILIRUBIN URINE: NEGATIVE
Glucose, UA: NEGATIVE mg/dL
Hgb urine dipstick: NEGATIVE
Ketones, ur: 40 mg/dL — AB
LEUKOCYTES UA: NEGATIVE
Nitrite: NEGATIVE
Protein, ur: NEGATIVE mg/dL
Specific Gravity, Urine: 1.022 (ref 1.005–1.030)
Urobilinogen, UA: 1 mg/dL (ref 0.0–1.0)
pH: 6.5 (ref 5.0–8.0)

## 2014-11-14 MED ORDER — ONDANSETRON 4 MG PO TBDP: 4.0000 mg | ORAL_TABLET | Freq: Three times a day (TID) | ORAL | Status: DC | PRN

## 2014-11-14 NOTE — Discharge Instructions (Signed)
Take the prescribed medication as directed to help with nausea.   Recommend to start with gentle diet and progress back to normal as tolerated. Follow-up with your primary care physician. Return to the ED for new or worsening symptoms.

## 2014-11-23 ENCOUNTER — Other Ambulatory Visit: Payer: Self-pay | Admitting: Orthopedic Surgery

## 2014-12-10 NOTE — Pre-Procedure Instructions (Signed)
Diamond White  12/10/2014   Your procedure is scheduled on: Friday, April 29th   [facility] Admitting at 8:00 AM.  Call this number if you have problems the morning of surgery: [phone]   Remember:   Do not eat food or drink liquids after midnight Thursday.    Take these medicines the morning of surgery with A SIP OF WATER: Oxycodone   Do not wear jewelry, make-up or nail polish.  Do not wear lotions, powders, or perfumes. You may NOT wear deodorant Thursday.   Do not shave underarms & legs 48 hours prior to surgery.   Do not bring valuables to the hospital.  [facility] is not responsible for any belongings or valuables.               Contacts, dentures or bridgework may not be worn into surgery.  Leave suitcase in the car. After surgery it may be brought to your room.  For patients admitted to the hospital, discharge time is determined by your treatment team.    Name and phone number of your driver:    Special Instructions: "Preparing for Surgery" instruction sheet.   Please read over the following fact sheets that you were given: Pain Booklet, Coughing and Deep Breathing, Blood Transfusion Information, MRSA Information and Surgical Site Infection Prevention

## 2014-12-10 NOTE — Progress Notes (Addendum)
Anesthesia Chart Review:  Pt is 74 year old female scheduled for R total knee arthroplasty on 12/24/2014 with Dr. Luiz BlareGraves.   Pt is scheduled for PAT 12/13/14. Preoperative labs will be drawn at that time.   PMH includes: HTN, hyperlipidemia. Never smoker. S/p L TKA 04/23/11.   Medications include: HCTZ, simvastatin.   Preoperative labs reviewed.    EKG: NSR. LAD.   If no changes, I anticipate pt can proceed with surgery as scheduled.   Rica Mastngela Visente Kirker, FNP-BC [facility]/Anesthesiology Phone: [phone] 12/13/2014 3:09 PM

## 2014-12-13 ENCOUNTER — Encounter (HOSPITAL_COMMUNITY)
Admission: RE | Admit: 2014-12-13 | Discharge: 2014-12-13 | Disposition: A | Discharge status: Home / Self Care | Payer: Medicare Other | Source: Ambulatory Visit | Attending: Orthopedic Surgery | Admitting: Orthopedic Surgery

## 2014-12-13 ENCOUNTER — Encounter (HOSPITAL_COMMUNITY): Payer: Self-pay

## 2014-12-13 DIAGNOSIS — Z01818 Encounter for other preprocedural examination: Secondary | ICD-10-CM | POA: Insufficient documentation

## 2014-12-13 HISTORY — DX: Hyperlipidemia, unspecified: E78.5

## 2014-12-13 HISTORY — DX: Other specified postprocedural states: Z98.890

## 2014-12-13 HISTORY — DX: Other specified postprocedural states: R11.2

## 2014-12-13 LAB — CBC WITH DIFFERENTIAL/PLATELET
BASOS PCT: 0 % (ref 0–1)
Basophils Absolute: 0 10*3/uL (ref 0.0–0.1)
Eosinophils Absolute: 0.3 10*3/uL (ref 0.0–0.7)
Eosinophils Relative: 3 % (ref 0–5)
HCT: 48 % — ABNORMAL HIGH (ref 36.0–46.0)
HEMOGLOBIN: 15.4 g/dL — AB (ref 12.0–15.0)
Lymphocytes Relative: 22 % (ref 12–46)
Lymphs Abs: 1.8 10*3/uL (ref 0.7–4.0)
MCH: 30.8 pg (ref 26.0–34.0)
MCHC: 32.1 g/dL (ref 30.0–36.0)
MCV: 96 fL (ref 78.0–100.0)
Monocytes Absolute: 0.6 10*3/uL (ref 0.1–1.0)
Monocytes Relative: 8 % (ref 3–12)
NEUTROS PCT: 67 % (ref 43–77)
Neutro Abs: 5.8 10*3/uL (ref 1.7–7.7)
Platelets: 189 10*3/uL (ref 150–400)
RBC: 5 MIL/uL (ref 3.87–5.11)
RDW: 13 % (ref 11.5–15.5)
WBC: 8.6 10*3/uL (ref 4.0–10.5)

## 2014-12-13 LAB — COMPREHENSIVE METABOLIC PANEL
ALT: 21 U/L (ref 0–35)
AST: 28 U/L (ref 0–37)
Albumin: 3.6 g/dL (ref 3.5–5.2)
Alkaline Phosphatase: 80 U/L (ref 39–117)
Anion gap: 12 (ref 5–15)
BUN: 16 mg/dL (ref 6–23)
CO2: 24 mmol/L (ref 19–32)
Calcium: 9.2 mg/dL (ref 8.4–10.5)
Chloride: 106 mmol/L (ref 96–112)
Creatinine, Ser: 0.88 mg/dL (ref 0.50–1.10)
GFR calc non Af Amer: 65 mL/min — ABNORMAL LOW (ref >90)
GFR, EST AFRICAN AMERICAN: 75 mL/min — AB (ref >90)
GLUCOSE: 155 mg/dL — AB (ref 70–99)
Potassium: 3.5 mmol/L (ref 3.5–5.1)
SODIUM: 142 mmol/L (ref 135–145)
TOTAL PROTEIN: 6.6 g/dL (ref 6.0–8.3)
Total Bilirubin: 0.6 mg/dL (ref 0.3–1.2)

## 2014-12-13 LAB — URINALYSIS, ROUTINE W REFLEX MICROSCOPIC
Bilirubin Urine: NEGATIVE
Glucose, UA: NEGATIVE mg/dL
Hgb urine dipstick: NEGATIVE
Ketones, ur: NEGATIVE mg/dL
LEUKOCYTES UA: NEGATIVE
Nitrite: NEGATIVE
PH: 5 (ref 5.0–8.0)
Protein, ur: NEGATIVE mg/dL
Specific Gravity, Urine: 1.021 (ref 1.005–1.030)
Urobilinogen, UA: 0.2 mg/dL (ref 0.0–1.0)

## 2014-12-13 LAB — TYPE AND SCREEN
ABO/RH(D): O POS
ANTIBODY SCREEN: NEGATIVE

## 2014-12-13 LAB — PROTIME-INR
INR: 0.99 (ref 0.00–1.49)
PROTHROMBIN TIME: 13.2 s (ref 11.6–15.2)

## 2014-12-13 LAB — SURGICAL PCR SCREEN
MRSA, PCR: NEGATIVE
Staphylococcus aureus: NEGATIVE

## 2014-12-13 LAB — APTT: APTT: 26 s (ref 24–37)

## 2014-12-13 MED ORDER — CHLORHEXIDINE GLUCONATE 4 % EX LIQD
60.0000 mL | Freq: Once | CUTANEOUS | Status: DC
  Filled 2014-12-13: qty 60

## 2014-12-24 ENCOUNTER — Inpatient Hospital Stay (HOSPITAL_COMMUNITY): Payer: Medicare Other | Admitting: Vascular Surgery

## 2014-12-24 ENCOUNTER — Encounter (HOSPITAL_COMMUNITY): Payer: Self-pay | Admitting: *Deleted

## 2014-12-24 ENCOUNTER — Inpatient Hospital Stay (HOSPITAL_COMMUNITY): Payer: Medicare Other | Admitting: Anesthesiology

## 2014-12-24 ENCOUNTER — Inpatient Hospital Stay (HOSPITAL_COMMUNITY)
Admission: RE | Admit: 2014-12-24 | Discharge: 2014-12-27 | DRG: 470 | Disposition: A | Discharge status: Skilled Nursing Facility | Payer: Medicare Other | Source: Ambulatory Visit | Attending: Orthopedic Surgery | Admitting: Orthopedic Surgery

## 2014-12-24 ENCOUNTER — Encounter (HOSPITAL_COMMUNITY)
Admission: RE | Disposition: A | Discharge status: Skilled Nursing Facility | Payer: Self-pay | Source: Ambulatory Visit | Attending: Orthopedic Surgery

## 2014-12-24 DIAGNOSIS — E785 Hyperlipidemia, unspecified: Secondary | ICD-10-CM | POA: Diagnosis present

## 2014-12-24 DIAGNOSIS — M1711 Unilateral primary osteoarthritis, right knee: Secondary | ICD-10-CM | POA: Diagnosis present

## 2014-12-24 DIAGNOSIS — I10 Essential (primary) hypertension: Secondary | ICD-10-CM | POA: Diagnosis present

## 2014-12-24 DIAGNOSIS — M25561 Pain in right knee: Secondary | ICD-10-CM | POA: Diagnosis present

## 2014-12-24 DIAGNOSIS — Z01818 Encounter for other preprocedural examination: Secondary | ICD-10-CM

## 2014-12-24 HISTORY — PX: TOTAL KNEE ARTHROPLASTY: SHX125

## 2014-12-24 SURGERY — ARTHROPLASTY, KNEE, TOTAL
Anesthesia: General | Laterality: Right

## 2014-12-24 MED ORDER — HYDROMORPHONE HCL 1 MG/ML IJ SOLN
0.2500 mg | INTRAMUSCULAR | Status: DC | PRN
  Administered 2014-12-24: 0.5 mg via INTRAVENOUS

## 2014-12-24 MED ORDER — LACTATED RINGERS IV SOLN
INTRAVENOUS | Status: DC
  Administered 2014-12-24: 09:00:00 via INTRAVENOUS

## 2014-12-24 MED ORDER — DOCUSATE SODIUM 100 MG PO CAPS
100.0000 mg | ORAL_CAPSULE | Freq: Two times a day (BID) | ORAL | Status: DC
  Administered 2014-12-24 – 2014-12-27 (×7): 100 mg via ORAL
  Filled 2014-12-24 (×7): qty 1

## 2014-12-24 MED ORDER — DIPHENHYDRAMINE HCL 12.5 MG/5ML PO ELIX: 12.5000 mg | ORAL_SOLUTION | ORAL | Status: DC | PRN

## 2014-12-24 MED ORDER — DIPHENHYDRAMINE HCL 50 MG/ML IJ SOLN
INTRAMUSCULAR | Status: DC | PRN
  Administered 2014-12-24: 12.5 mg via INTRAVENOUS

## 2014-12-24 MED ORDER — OXYCODONE-ACETAMINOPHEN 5-325 MG PO TABS
1.0000 | ORAL_TABLET | ORAL | Status: DC | PRN
  Administered 2014-12-24 (×2): 1 via ORAL
  Administered 2014-12-25: 2 via ORAL
  Administered 2014-12-25 – 2014-12-26 (×2): 1 via ORAL
  Administered 2014-12-27: 2 via ORAL
  Filled 2014-12-24 (×2): qty 1
  Filled 2014-12-24 (×2): qty 2
  Filled 2014-12-24: qty 1
  Filled 2014-12-24: qty 2

## 2014-12-24 MED ORDER — METHOCARBAMOL 500 MG PO TABS
500.0000 mg | ORAL_TABLET | Freq: Four times a day (QID) | ORAL | Status: DC | PRN
  Administered 2014-12-24 – 2014-12-26 (×3): 500 mg via ORAL
  Filled 2014-12-24 (×3): qty 1

## 2014-12-24 MED ORDER — ACETAMINOPHEN 650 MG RE SUPP: 650.0000 mg | Freq: Four times a day (QID) | RECTAL | Status: DC | PRN

## 2014-12-24 MED ORDER — KETOROLAC TROMETHAMINE 30 MG/ML IJ SOLN: 15.0000 mg | Freq: Once | INTRAMUSCULAR | Status: DC | PRN

## 2014-12-24 MED ORDER — TRANEXAMIC ACID 1000 MG/10ML IV SOLN
1000.0000 mg | INTRAVENOUS | Status: DC
  Filled 2014-12-24: qty 10

## 2014-12-24 MED ORDER — TRANEXAMIC ACID 1000 MG/10ML IV SOLN
1000.0000 mg | INTRAVENOUS | Status: DC | PRN
  Administered 2014-12-24: 1000 mg via INTRAVENOUS

## 2014-12-24 MED ORDER — MEPERIDINE HCL 25 MG/ML IJ SOLN: 6.2500 mg | INTRAMUSCULAR | Status: DC | PRN

## 2014-12-24 MED ORDER — BUPIVACAINE LIPOSOME 1.3 % IJ SUSP
20.0000 mL | INTRAMUSCULAR | Status: AC
  Administered 2014-12-24: 20 mL
  Filled 2014-12-24: qty 20

## 2014-12-24 MED ORDER — OXYCODONE-ACETAMINOPHEN 5-325 MG PO TABS: 1.0000 | ORAL_TABLET | ORAL | Status: DC | PRN

## 2014-12-24 MED ORDER — FLEET ENEMA 7-19 GM/118ML RE ENEM
1.0000 | ENEMA | Freq: Once | RECTAL | Status: AC | PRN
Start: 2014-12-24 — End: 2014-12-24

## 2014-12-24 MED ORDER — LACTATED RINGERS IV SOLN
INTRAVENOUS | Status: DC | PRN
  Administered 2014-12-24 (×2): via INTRAVENOUS

## 2014-12-24 MED ORDER — NEOSTIGMINE METHYLSULFATE 10 MG/10ML IV SOLN
INTRAVENOUS | Status: DC | PRN
  Administered 2014-12-24: 4 mg via INTRAVENOUS

## 2014-12-24 MED ORDER — ONDANSETRON HCL 4 MG PO TABS: 4.0000 mg | ORAL_TABLET | Freq: Four times a day (QID) | ORAL | Status: DC | PRN

## 2014-12-24 MED ORDER — KETOROLAC TROMETHAMINE 30 MG/ML IJ SOLN
INTRAMUSCULAR | Status: DC | PRN
  Administered 2014-12-24: 30 mg via INTRAVENOUS

## 2014-12-24 MED ORDER — ONDANSETRON HCL 4 MG/2ML IJ SOLN
4.0000 mg | Freq: Four times a day (QID) | INTRAMUSCULAR | Status: DC | PRN
  Filled 2014-12-24: qty 2

## 2014-12-24 MED ORDER — ASPIRIN EC 325 MG PO TBEC
325.0000 mg | DELAYED_RELEASE_TABLET | Freq: Two times a day (BID) | ORAL | Status: DC
Start: 2014-12-24 — End: 2016-12-27

## 2014-12-24 MED ORDER — FAMOTIDINE IN NACL 20-0.9 MG/50ML-% IV SOLN
20.0000 mg | INTRAVENOUS | Status: AC
  Administered 2014-12-24: 20 mg via INTRAVENOUS
  Filled 2014-12-24: qty 50

## 2014-12-24 MED ORDER — POLYETHYLENE GLYCOL 3350 17 G PO PACK: 17.0000 g | PACK | Freq: Every day | ORAL | Status: DC | PRN

## 2014-12-24 MED ORDER — MIDAZOLAM HCL 2 MG/2ML IJ SOLN
INTRAMUSCULAR | Status: AC
  Filled 2014-12-24: qty 2

## 2014-12-24 MED ORDER — FENTANYL CITRATE (PF) 250 MCG/5ML IJ SOLN
INTRAMUSCULAR | Status: AC
  Filled 2014-12-24: qty 5

## 2014-12-24 MED ORDER — HYDROMORPHONE HCL 1 MG/ML IJ SOLN
INTRAMUSCULAR | Status: AC
  Filled 2014-12-24: qty 1

## 2014-12-24 MED ORDER — HYDROMORPHONE HCL 1 MG/ML IJ SOLN
1.0000 mg | INTRAMUSCULAR | Status: DC | PRN
  Administered 2014-12-24: 1 mg via INTRAVENOUS
  Filled 2014-12-24: qty 1

## 2014-12-24 MED ORDER — DEXAMETHASONE SODIUM PHOSPHATE 10 MG/ML IJ SOLN
10.0000 mg | Freq: Two times a day (BID) | INTRAMUSCULAR | Status: AC
  Administered 2014-12-24 – 2014-12-25 (×4): 10 mg via INTRAVENOUS
  Filled 2014-12-24 (×4): qty 1

## 2014-12-24 MED ORDER — SIMVASTATIN 20 MG PO TABS
20.0000 mg | ORAL_TABLET | Freq: Every evening | ORAL | Status: DC
  Administered 2014-12-24 – 2014-12-26 (×2): 20 mg via ORAL
  Filled 2014-12-24 (×2): qty 1

## 2014-12-24 MED ORDER — HYDROCHLOROTHIAZIDE 25 MG PO TABS
12.5000 mg | ORAL_TABLET | Freq: Every day | ORAL | Status: DC
  Administered 2014-12-24 – 2014-12-27 (×4): 12.5 mg via ORAL
  Filled 2014-12-24 (×4): qty 1

## 2014-12-24 MED ORDER — ALUM & MAG HYDROXIDE-SIMETH 200-200-20 MG/5ML PO SUSP: 30.0000 mL | ORAL | Status: DC | PRN

## 2014-12-24 MED ORDER — ONDANSETRON HCL 4 MG/2ML IJ SOLN
INTRAMUSCULAR | Status: DC | PRN
  Administered 2014-12-24: 4 mg via INTRAVENOUS

## 2014-12-24 MED ORDER — CEFAZOLIN SODIUM-DEXTROSE 2-3 GM-% IV SOLR
INTRAVENOUS | Status: AC
  Filled 2014-12-24: qty 50

## 2014-12-24 MED ORDER — METHOCARBAMOL 1000 MG/10ML IJ SOLN
500.0000 mg | Freq: Four times a day (QID) | INTRAVENOUS | Status: DC | PRN
  Filled 2014-12-24: qty 5

## 2014-12-24 MED ORDER — MIDAZOLAM HCL 2 MG/2ML IJ SOLN: 2.0000 mg | Freq: Once | INTRAMUSCULAR | Status: DC

## 2014-12-24 MED ORDER — MIDAZOLAM HCL 5 MG/5ML IJ SOLN
INTRAMUSCULAR | Status: DC | PRN
  Administered 2014-12-24: 2 mg via INTRAVENOUS

## 2014-12-24 MED ORDER — PHENYLEPHRINE HCL 10 MG/ML IJ SOLN
INTRAMUSCULAR | Status: DC | PRN
  Administered 2014-12-24 (×3): 80 ug via INTRAVENOUS

## 2014-12-24 MED ORDER — METHOCARBAMOL 750 MG PO TABS: 750.0000 mg | ORAL_TABLET | Freq: Three times a day (TID) | ORAL | Status: DC | PRN

## 2014-12-24 MED ORDER — METOCLOPRAMIDE HCL 5 MG/ML IJ SOLN: 10.0000 mg | Freq: Once | INTRAMUSCULAR | Status: DC | PRN

## 2014-12-24 MED ORDER — FENTANYL CITRATE (PF) 100 MCG/2ML IJ SOLN
INTRAMUSCULAR | Status: DC | PRN
  Administered 2014-12-24: 50 ug via INTRAVENOUS
  Administered 2014-12-24: 100 ug via INTRAVENOUS

## 2014-12-24 MED ORDER — ROCURONIUM BROMIDE 100 MG/10ML IV SOLN
INTRAVENOUS | Status: DC | PRN
  Administered 2014-12-24: 40 mg via INTRAVENOUS

## 2014-12-24 MED ORDER — DEXAMETHASONE SODIUM PHOSPHATE 4 MG/ML IJ SOLN
INTRAMUSCULAR | Status: DC | PRN
  Administered 2014-12-24: 4 mg via INTRAVENOUS

## 2014-12-24 MED ORDER — LIDOCAINE HCL (CARDIAC) 20 MG/ML IV SOLN
INTRAVENOUS | Status: DC | PRN
  Administered 2014-12-24: 50 mg via INTRAVENOUS

## 2014-12-24 MED ORDER — ASPIRIN EC 325 MG PO TBEC
325.0000 mg | DELAYED_RELEASE_TABLET | Freq: Two times a day (BID) | ORAL | Status: DC
  Administered 2014-12-24 – 2014-12-27 (×5): 325 mg via ORAL
  Filled 2014-12-24 (×5): qty 1

## 2014-12-24 MED ORDER — GLYCOPYRROLATE 0.2 MG/ML IJ SOLN
INTRAMUSCULAR | Status: DC | PRN
  Administered 2014-12-24: 0.6 mg via INTRAVENOUS

## 2014-12-24 MED ORDER — PROPOFOL 10 MG/ML IV BOLUS
INTRAVENOUS | Status: AC
  Filled 2014-12-24: qty 20

## 2014-12-24 MED ORDER — 0.9 % SODIUM CHLORIDE (POUR BTL) OPTIME
TOPICAL | Status: DC | PRN
  Administered 2014-12-24: 1000 mL
  Administered 2014-12-24: 3000 mL

## 2014-12-24 MED ORDER — FENTANYL CITRATE (PF) 100 MCG/2ML IJ SOLN: 100.0000 ug | Freq: Once | INTRAMUSCULAR | Status: DC

## 2014-12-24 MED ORDER — KETOROLAC TROMETHAMINE 30 MG/ML IJ SOLN
INTRAMUSCULAR | Status: AC
  Filled 2014-12-24: qty 1

## 2014-12-24 MED ORDER — BUPIVACAINE HCL (PF) 0.5 % IJ SOLN
INTRAMUSCULAR | Status: DC | PRN
  Administered 2014-12-24: 20 mL

## 2014-12-24 MED ORDER — CEFAZOLIN SODIUM-DEXTROSE 2-3 GM-% IV SOLR
2.0000 g | Freq: Four times a day (QID) | INTRAVENOUS | Status: AC
  Administered 2014-12-24 (×2): 2 g via INTRAVENOUS
  Filled 2014-12-24 (×3): qty 50

## 2014-12-24 MED ORDER — ACETAMINOPHEN 325 MG PO TABS: 650.0000 mg | ORAL_TABLET | Freq: Four times a day (QID) | ORAL | Status: DC | PRN

## 2014-12-24 MED ORDER — KETOROLAC TROMETHAMINE 15 MG/ML IJ SOLN
15.0000 mg | Freq: Three times a day (TID) | INTRAMUSCULAR | Status: AC
  Administered 2014-12-24 – 2014-12-25 (×2): 15 mg via INTRAVENOUS
  Filled 2014-12-24 (×2): qty 1

## 2014-12-24 MED ORDER — PROPOFOL 10 MG/ML IV BOLUS
INTRAVENOUS | Status: DC | PRN
  Administered 2014-12-24: 150 mg via INTRAVENOUS

## 2014-12-24 MED ORDER — FENTANYL CITRATE (PF) 100 MCG/2ML IJ SOLN
INTRAMUSCULAR | Status: AC
  Filled 2014-12-24: qty 2

## 2014-12-24 MED ORDER — CEFAZOLIN SODIUM-DEXTROSE 2-3 GM-% IV SOLR
2.0000 g | INTRAVENOUS | Status: AC
  Administered 2014-12-24: 2 g via INTRAVENOUS

## 2014-12-24 MED ORDER — BISACODYL 10 MG RE SUPP: 10.0000 mg | Freq: Every day | RECTAL | Status: DC | PRN

## 2014-12-24 MED ORDER — SODIUM CHLORIDE 0.9 % IV SOLN
INTRAVENOUS | Status: DC
  Administered 2014-12-24: 14:00:00 via INTRAVENOUS

## 2014-12-24 SURGICAL SUPPLY — 75 items
APL SKNCLS STERI-STRIP NONHPOA (GAUZE/BANDAGES/DRESSINGS) ×1
BANDAGE ESMARK 6X9 LF (GAUZE/BANDAGES/DRESSINGS) ×1 IMPLANT
BENZOIN TINCTURE PRP APPL 2/3 (GAUZE/BANDAGES/DRESSINGS) ×3 IMPLANT
BLADE SAGITTAL 25.0X1.19X90 (BLADE) ×2 IMPLANT
BLADE SAGITTAL 25.0X1.19X90MM (BLADE) ×1
BLADE SAW SAG 90X13X1.27 (BLADE) ×3 IMPLANT
BNDG CMPR 9X6 STRL LF SNTH (GAUZE/BANDAGES/DRESSINGS) ×1
BNDG ESMARK 6X9 LF (GAUZE/BANDAGES/DRESSINGS) ×3
BOWL SMART MIX CTS (DISPOSABLE) ×3 IMPLANT
CAP KNEE TOTAL 3 SIGMA ×2 IMPLANT
CEMENT HV SMART SET (Cement) ×6 IMPLANT
CLOSURE WOUND 1/2 X4 (GAUZE/BANDAGES/DRESSINGS) ×2
COVER SURGICAL LIGHT HANDLE (MISCELLANEOUS) ×3 IMPLANT
CUFF TOURNIQUET SINGLE 34IN LL (TOURNIQUET CUFF) ×3 IMPLANT
CUFF TOURNIQUET SINGLE 44IN (TOURNIQUET CUFF) IMPLANT
DRAPE EXTREMITY T 121X128X90 (DRAPE) ×3 IMPLANT
DRAPE IMP U-DRAPE 54X76 (DRAPES) ×3 IMPLANT
DRAPE U-SHAPE 47X51 STRL (DRAPES) ×3 IMPLANT
DRSG MEPILEX BORDER 4X12 (GAUZE/BANDAGES/DRESSINGS) ×3 IMPLANT
DRSG MEPILEX BORDER 4X8 (GAUZE/BANDAGES/DRESSINGS) IMPLANT
DRSG PAD ABDOMINAL 8X10 ST (GAUZE/BANDAGES/DRESSINGS) ×3 IMPLANT
DURAPREP 26ML APPLICATOR (WOUND CARE) ×3 IMPLANT
ELECT REM PT RETURN 9FT ADLT (ELECTROSURGICAL) ×3
ELECTRODE REM PT RTRN 9FT ADLT (ELECTROSURGICAL) ×1 IMPLANT
EVACUATOR 1/8 PVC DRAIN (DRAIN) ×3 IMPLANT
FACESHIELD WRAPAROUND (MASK) ×3 IMPLANT
FACESHIELD WRAPAROUND OR TEAM (MASK) ×1 IMPLANT
GAUZE SPONGE 4X4 12PLY STRL (GAUZE/BANDAGES/DRESSINGS) ×3 IMPLANT
GLOVE BIO SURGEON STRL SZ7 (GLOVE) ×2 IMPLANT
GLOVE BIOGEL PI IND STRL 6.5 (GLOVE) IMPLANT
GLOVE BIOGEL PI IND STRL 7.0 (GLOVE) IMPLANT
GLOVE BIOGEL PI IND STRL 8 (GLOVE) ×2 IMPLANT
GLOVE BIOGEL PI INDICATOR 6.5 (GLOVE) ×2
GLOVE BIOGEL PI INDICATOR 7.0 (GLOVE) ×6
GLOVE BIOGEL PI INDICATOR 8 (GLOVE) ×4
GLOVE BIOGEL PI ORTHO PRO SZ7 (GLOVE) ×2
GLOVE ECLIPSE 7.5 STRL STRAW (GLOVE) ×6 IMPLANT
GLOVE PI ORTHO PRO STRL SZ7 (GLOVE) IMPLANT
GLOVE SURG SS PI 6.0 STRL IVOR (GLOVE) ×2 IMPLANT
GOWN STRL REUS W/ TWL LRG LVL3 (GOWN DISPOSABLE) ×1 IMPLANT
GOWN STRL REUS W/ TWL XL LVL3 (GOWN DISPOSABLE) ×2 IMPLANT
GOWN STRL REUS W/TWL LRG LVL3 (GOWN DISPOSABLE) ×3
GOWN STRL REUS W/TWL XL LVL3 (GOWN DISPOSABLE) ×6
HANDPIECE INTERPULSE COAX TIP (DISPOSABLE) ×3
HOOD PEEL AWAY FACE SHEILD DIS (HOOD) ×9 IMPLANT
IMMOBILIZER KNEE 20 (SOFTGOODS) IMPLANT
IMMOBILIZER KNEE 22 (SOFTGOODS) ×2 IMPLANT
IMMOBILIZER KNEE 22 UNIV (SOFTGOODS) ×3 IMPLANT
KIT BASIN OR (CUSTOM PROCEDURE TRAY) ×3 IMPLANT
KIT ROOM TURNOVER OR (KITS) ×3 IMPLANT
MANIFOLD NEPTUNE II (INSTRUMENTS) ×3 IMPLANT
NDL SPNL 22GX3.5 QUINCKE BK (NEEDLE) ×1 IMPLANT
NEEDLE SPNL 22GX3.5 QUINCKE BK (NEEDLE) ×3 IMPLANT
NS IRRIG 1000ML POUR BTL (IV SOLUTION) ×3 IMPLANT
PACK TOTAL JOINT (CUSTOM PROCEDURE TRAY) ×3 IMPLANT
PACK UNIVERSAL I (CUSTOM PROCEDURE TRAY) ×3 IMPLANT
PAD ABD 8X10 STRL (GAUZE/BANDAGES/DRESSINGS) ×2 IMPLANT
PAD ARMBOARD 7.5X6 YLW CONV (MISCELLANEOUS) ×6 IMPLANT
PAD CAST 4YDX4 CTTN HI CHSV (CAST SUPPLIES) ×1 IMPLANT
PADDING CAST COTTON 4X4 STRL (CAST SUPPLIES) ×3
PADDING CAST COTTON 6X4 STRL (CAST SUPPLIES) ×2 IMPLANT
SET HNDPC FAN SPRY TIP SCT (DISPOSABLE) ×1 IMPLANT
SPONGE GAUZE 4X4 12PLY STER LF (GAUZE/BANDAGES/DRESSINGS) ×2 IMPLANT
STAPLER VISISTAT 35W (STAPLE) IMPLANT
STRIP CLOSURE SKIN 1/2X4 (GAUZE/BANDAGES/DRESSINGS) ×4 IMPLANT
SUCTION FRAZIER TIP 10 FR DISP (SUCTIONS) ×3 IMPLANT
SUT MNCRL AB 3-0 PS2 18 (SUTURE) IMPLANT
SUT VIC AB 0 CTB1 27 (SUTURE) ×6 IMPLANT
SUT VIC AB 1 CT1 27 (SUTURE) ×9
SUT VIC AB 1 CT1 27XBRD ANBCTR (SUTURE) ×2 IMPLANT
SUT VIC AB 2-0 CTB1 (SUTURE) ×6 IMPLANT
SYR 50ML LL SCALE MARK (SYRINGE) ×3 IMPLANT
TOWEL OR 17X24 6PK STRL BLUE (TOWEL DISPOSABLE) ×3 IMPLANT
TOWEL OR 17X26 10 PK STRL BLUE (TOWEL DISPOSABLE) ×3 IMPLANT
TRAY FOLEY CATH 16FRSI W/METER (SET/KITS/TRAYS/PACK) IMPLANT

## 2014-12-24 NOTE — H&P (Signed)
TOTAL KNEE ADMISSION H&P  Patient is being admitted for right total knee arthroplasty.  Subjective:  Chief Complaint:right knee pain.  HPI: Diamond White, 74 y.o. female, has a history of pain and functional disability in the right knee due to arthritis and has failed non-surgical conservative treatments for greater than 12 weeks to includeNSAID's and/or analgesics, corticosteriod injections, viscosupplementation injections, use of assistive devices, weight reduction as appropriate and activity modification.  Onset of symptoms was gradual, starting 5 years ago with gradually worsening course since that time. The patient noted prior procedures on the knee to include  arthroscopy and menisectomy on the right knee(s).  Patient currently rates pain in the right knee(s) at 6 out of 10 with activity. Patient has night pain, worsening of pain with activity and weight bearing, pain that interferes with activities of daily living, pain with passive range of motion and joint swelling.  Patient has evidence of subchondral sclerosis, joint subluxation and joint space narrowing by imaging studies. This patient has had failure of all reasonable conservative care. There is no active infection.  There are no active problems to display for this patient.  Past Medical History  Diagnosis Date  . Hypertension   . Arthritis   . PONV (postoperative nausea and vomiting)   . Hyperlipidemia     Past Surgical History  Procedure Laterality Date  . Appendectomy    . Replacement total knee    . Tonsillectomy    . Abdominal hysterectomy      Prescriptions prior to admission  Medication Sig Dispense Refill Last Dose  . Calcium Carb-Cholecalciferol (CALCIUM + D3) 600-200 MG-UNIT TABS Take 1 tablet by mouth daily.   Past Month at Unknown time  . hydrochlorothiazide (HYDRODIURIL) 12.5 MG tablet Take 12.5 mg by mouth daily.  1 12/23/2014 at Unknown time  . oxyCODONE-acetaminophen (PERCOCET/ROXICET) 5-325 MG per tablet  Take 1 tablet by mouth daily as needed for moderate pain.    Past Week at Unknown time  . simvastatin (ZOCOR) 20 MG tablet Take 20 mg by mouth every evening.  3 Past Week at Unknown time  . ondansetron (ZOFRAN ODT) 4 MG disintegrating tablet Take 1 tablet (4 mg total) by mouth every 8 (eight) hours as needed for nausea. 12 tablet 0 More than a month at Unknown time   Allergies  Allergen Reactions  . Compazine [Prochlorperazine] Other (See Comments)    Oral swelling   . Phenothiazines Swelling and Other (See Comments)    Oral swelling    History  Substance Use Topics  . Smoking status: Never Smoker   . Smokeless tobacco: Never Used  . Alcohol Use: No    History reviewed. No pertinent family history.   ROS ROS: I have reviewed the patient's review of systems thoroughly and there are no positive responses as relates to the HPI.  Objective:  Physical Exam  Vital signs in last 24 hours: Temp:  [98.2 F (36.8 C)] 98.2 F (36.8 C) (04/29 0808) Pulse Rate:  [87] 87 (04/29 0808) Resp:  [20] 20 (04/29 0808) BP: (139)/(53) 139/53 mmHg (04/29 0808) SpO2:  [98 %] 98 % (04/29 0808) Weight:  [216 lb (97.977 kg)] 216 lb (97.977 kg) (04/29 0808) Well-developed well-nourished patient in no acute distress. Alert and oriented x3 HEENT:within normal limits Cardiac: Regular rate and rhythm Pulmonary: Lungs clear to auscultation Abdomen: Soft and nontender.  Normal active bowel sounds  Musculoskeletal: (right knee: Tender to palpation over the medial joint line.  Positive McMurray.  Trace  effusion.  Grinding through range of motion.  No instability.  Labs: Recent Results (from the past 2160 hour(s))  CBC with Differential     Status: Abnormal   Collection Time: 11/13/14 10:21 PM  Result Value Ref Range   WBC 8.1 4.0 - 10.5 K/uL   RBC 4.57 3.87 - 5.11 MIL/uL   Hemoglobin 14.4 12.0 - 15.0 g/dL   HCT 43.8 36.0 - 46.0 %   MCV 95.8 78.0 - 100.0 fL   MCH 31.5 26.0 - 34.0 pg   MCHC 32.9  30.0 - 36.0 g/dL   RDW 12.7 11.5 - 15.5 %   Platelets 182 150 - 400 K/uL   Neutrophils Relative % 85 (H) 43 - 77 %   Neutro Abs 6.9 1.7 - 7.7 K/uL   Lymphocytes Relative 10 (L) 12 - 46 %   Lymphs Abs 0.8 0.7 - 4.0 K/uL   Monocytes Relative 5 3 - 12 %   Monocytes Absolute 0.4 0.1 - 1.0 K/uL   Eosinophils Relative 0 0 - 5 %   Eosinophils Absolute 0.0 0.0 - 0.7 K/uL   Basophils Relative 0 0 - 1 %   Basophils Absolute 0.0 0.0 - 0.1 K/uL  Comprehensive metabolic panel     Status: Abnormal   Collection Time: 11/13/14 10:21 PM  Result Value Ref Range   Sodium 142 135 - 145 mmol/L   Potassium 4.0 3.5 - 5.1 mmol/L   Chloride 106 96 - 112 mmol/L   CO2 23 19 - 32 mmol/L   Glucose, Bld 174 (H) 70 - 99 mg/dL   BUN 14 6 - 23 mg/dL   Creatinine, Ser 0.68 0.50 - 1.10 mg/dL   Calcium 9.1 8.4 - 10.5 mg/dL   Total Protein 6.7 6.0 - 8.3 g/dL   Albumin 3.9 3.5 - 5.2 g/dL   AST 22 0 - 37 U/L   ALT 18 0 - 35 U/L   Alkaline Phosphatase 78 39 - 117 U/L   Total Bilirubin 0.7 0.3 - 1.2 mg/dL   GFR calc non Af Amer 86 (L) >90 mL/min   GFR calc Af Amer >90 >90 mL/min    Comment: (NOTE) The eGFR has been calculated using the CKD EPI equation. This calculation has not been validated in all clinical situations. eGFR's persistently <90 mL/min signify possible Chronic Kidney Disease.    Anion gap 13 5 - 15  Lipase, blood     Status: None   Collection Time: 11/13/14 10:21 PM  Result Value Ref Range   Lipase 44 11 - 59 U/L  Urinalysis, Routine w reflex microscopic     Status: Abnormal   Collection Time: 11/13/14 11:06 PM  Result Value Ref Range   Color, Urine YELLOW YELLOW   APPearance CLEAR CLEAR   Specific Gravity, Urine 1.022 1.005 - 1.030   pH 6.5 5.0 - 8.0   Glucose, UA NEGATIVE NEGATIVE mg/dL   Hgb urine dipstick NEGATIVE NEGATIVE   Bilirubin Urine NEGATIVE NEGATIVE   Ketones, ur 40 (A) NEGATIVE mg/dL   Protein, ur NEGATIVE NEGATIVE mg/dL   Urobilinogen, UA 1.0 0.0 - 1.0 mg/dL   Nitrite  NEGATIVE NEGATIVE   Leukocytes, UA NEGATIVE NEGATIVE    Comment: MICROSCOPIC NOT DONE ON URINES WITH NEGATIVE PROTEIN, BLOOD, LEUKOCYTES, NITRITE, OR GLUCOSE <1000 mg/dL.  Surgical pcr screen     Status: None   Collection Time: 12/13/14 10:24 AM  Result Value Ref Range   MRSA, PCR NEGATIVE NEGATIVE   Staphylococcus aureus NEGATIVE NEGATIVE  Comment:        The Xpert SA Assay (FDA approved for NASAL specimens in patients over 10 years of age), is one component of a comprehensive surveillance program.  Test performance has been validated by [facility] for patients greater than or equal to 38 year old. It is not intended to diagnose infection nor to guide or monitor treatment.   APTT     Status: None   Collection Time: 12/13/14 10:24 AM  Result Value Ref Range   aPTT 26 24 - 37 seconds  CBC WITH DIFFERENTIAL     Status: Abnormal   Collection Time: 12/13/14 10:24 AM  Result Value Ref Range   WBC 8.6 4.0 - 10.5 K/uL   RBC 5.00 3.87 - 5.11 MIL/uL   Hemoglobin 15.4 (H) 12.0 - 15.0 g/dL   HCT 48.0 (H) 36.0 - 46.0 %   MCV 96.0 78.0 - 100.0 fL   MCH 30.8 26.0 - 34.0 pg   MCHC 32.1 30.0 - 36.0 g/dL   RDW 13.0 11.5 - 15.5 %   Platelets 189 150 - 400 K/uL   Neutrophils Relative % 67 43 - 77 %   Neutro Abs 5.8 1.7 - 7.7 K/uL   Lymphocytes Relative 22 12 - 46 %   Lymphs Abs 1.8 0.7 - 4.0 K/uL   Monocytes Relative 8 3 - 12 %   Monocytes Absolute 0.6 0.1 - 1.0 K/uL   Eosinophils Relative 3 0 - 5 %   Eosinophils Absolute 0.3 0.0 - 0.7 K/uL   Basophils Relative 0 0 - 1 %   Basophils Absolute 0.0 0.0 - 0.1 K/uL  Comprehensive metabolic panel     Status: Abnormal   Collection Time: 12/13/14 10:24 AM  Result Value Ref Range   Sodium 142 135 - 145 mmol/L   Potassium 3.5 3.5 - 5.1 mmol/L   Chloride 106 96 - 112 mmol/L   CO2 24 19 - 32 mmol/L   Glucose, Bld 155 (H) 70 - 99 mg/dL   BUN 16 6 - 23 mg/dL   Creatinine, Ser 0.88 0.50 - 1.10 mg/dL   Calcium 9.2 8.4 - 10.5 mg/dL   Total  Protein 6.6 6.0 - 8.3 g/dL   Albumin 3.6 3.5 - 5.2 g/dL   AST 28 0 - 37 U/L   ALT 21 0 - 35 U/L   Alkaline Phosphatase 80 39 - 117 U/L   Total Bilirubin 0.6 0.3 - 1.2 mg/dL   GFR calc non Af Amer 65 (L) >90 mL/min   GFR calc Af Amer 75 (L) >90 mL/min    Comment: (NOTE) The eGFR has been calculated using the CKD EPI equation. This calculation has not been validated in all clinical situations. eGFR's persistently <90 mL/min signify possible Chronic Kidney Disease.    Anion gap 12 5 - 15  Protime-INR     Status: None   Collection Time: 12/13/14 10:24 AM  Result Value Ref Range   Prothrombin Time 13.2 11.6 - 15.2 seconds   INR 0.99 0.00 - 1.49  Urinalysis, Routine w reflex microscopic     Status: None   Collection Time: 12/13/14 10:24 AM  Result Value Ref Range   Color, Urine YELLOW YELLOW    Comment: LESS THAN 10 mL OF URINE SUBMITTED   APPearance CLEAR CLEAR   Specific Gravity, Urine 1.021 1.005 - 1.030   pH 5.0 5.0 - 8.0   Glucose, UA NEGATIVE NEGATIVE mg/dL   Hgb urine dipstick NEGATIVE NEGATIVE   Bilirubin  Urine NEGATIVE NEGATIVE   Ketones, ur NEGATIVE NEGATIVE mg/dL   Protein, ur NEGATIVE NEGATIVE mg/dL   Urobilinogen, UA 0.2 0.0 - 1.0 mg/dL   Nitrite NEGATIVE NEGATIVE   Leukocytes, UA NEGATIVE NEGATIVE    Comment: MICROSCOPIC NOT DONE ON URINES WITH NEGATIVE PROTEIN, BLOOD, LEUKOCYTES, NITRITE, OR GLUCOSE <1000 mg/dL.  Type and screen     Status: None   Collection Time: 12/13/14 10:35 AM  Result Value Ref Range   ABO/RH(D) O POS    Antibody Screen NEG    Sample Expiration 12/27/2014      Estimated body mass index is 32.85 kg/(m^2) as calculated from the following:   Height as of 12/13/14: '5\' 8"'  (1.727 m).   Weight as of this encounter: 216 lb (97.977 kg).   Imaging Review Plain radiographs demonstrate severe degenerative joint disease of the right knee(s). The overall alignment ismild varus. The bone quality appears to be good for age and reported activity  level.  Assessment/Plan:  End stage arthritis, right knee   The patient history, physical examination, clinical judgment of the provider and imaging studies are consistent with end stage degenerative joint disease of the right knee(s) and total knee arthroplasty is deemed medically necessary. The treatment options including medical management, injection therapy arthroscopy and arthroplasty were discussed at length. The risks and benefits of total knee arthroplasty were presented and reviewed. The risks due to aseptic loosening, infection, stiffness, patella tracking problems, thromboembolic complications and other imponderables were discussed. The patient acknowledged the explanation, agreed to proceed with the plan and consent was signed. Patient is being admitted for inpatient treatment for surgery, pain control, PT, OT, prophylactic antibiotics, VTE prophylaxis, progressive ambulation and ADL's and discharge planning. The patient is planning to be discharged home with home health services

## 2014-12-24 NOTE — Progress Notes (Signed)
Utilization review completed.  

## 2014-12-24 NOTE — Anesthesia Preprocedure Evaluation (Addendum)
Anesthesia Evaluation  Patient identified by MRN, date of birth, ID band Patient awake    Reviewed: Allergy & Precautions, NPO status , Patient's Chart, lab work & pertinent test results  History of Anesthesia Complications (+) PONV and history of anesthetic complications  Airway Mallampati: II  TM Distance: >3 FB Neck ROM: Full    Dental no notable dental hx.    Pulmonary neg pulmonary ROS,  breath sounds clear to auscultation  Pulmonary exam normal       Cardiovascular hypertension, Pt. on medications Rhythm:Regular Rate:Normal     Neuro/Psych negative neurological ROS  negative psych ROS   GI/Hepatic negative GI ROS, Neg liver ROS,   Endo/Other  negative endocrine ROS  Renal/GU negative Renal ROS     Musculoskeletal  (+) Arthritis -,   Abdominal   Peds  Hematology negative hematology ROS (+)   Anesthesia Other Findings   Reproductive/Obstetrics negative OB ROS                            Anesthesia Physical Anesthesia Plan  ASA: II  Anesthesia Plan: General   Post-op Pain Management:    Induction: Intravenous  Airway Management Planned: Oral ETT  Additional Equipment: None  Intra-op Plan:   Post-operative Plan: Extubation in OR  Informed Consent: I have reviewed the patients History and Physical, chart, labs and discussed the procedure including the risks, benefits and alternatives for the proposed anesthesia with the patient or authorized representative who has indicated his/her understanding and acceptance.   Dental advisory given  Plan Discussed with: CRNA  Anesthesia Plan Comments:        Anesthesia Quick Evaluation

## 2014-12-24 NOTE — Discharge Instructions (Signed)

## 2014-12-24 NOTE — Plan of Care (Signed)
Problem: Consults Goal: Diagnosis- Total Joint Replacement Outcome: Completed/Met Date Met:  12/24/14 Primary Total Knee right

## 2014-12-24 NOTE — Progress Notes (Signed)
Orthopedic Tech Progress Note Patient Details:  Diamond LainMartha G White 1941-05-14 [id number] CPM applied to RLE with appropriate settings. Footsie roll provided. CPM Right Knee CPM Right Knee: On Right Knee Flexion (Degrees): 90 Right Knee Extension (Degrees): 0   [name] 12/24/2014, 12:50 PM

## 2014-12-24 NOTE — Transfer of Care (Signed)
Immediate Anesthesia Transfer of Care Note  Patient: Diamond LainMartha G Eunice  Procedure(s) Performed: Procedure(s): TOTAL KNEE ARTHROPLASTY (Right)  Patient Location: PACU  Anesthesia Type:General  Level of Consciousness: awake, alert  and oriented  Airway & Oxygen Therapy: Patient Spontanous Breathing and Patient connected to nasal cannula oxygen  Post-op Assessment: Report given to RN, Post -op Vital signs reviewed and stable and Patient moving all extremities  Post vital signs: Reviewed and stable  Last Vitals:  Filed Vitals:   12/24/14 0808  BP: 139/53  Pulse: 87  Temp: 36.8 C  Resp: 20    Complications: No apparent anesthesia complications

## 2014-12-24 NOTE — Anesthesia Procedure Notes (Signed)
Procedure Name: Intubation Date/Time: 12/24/2014 9:41 AM Performed by: Orvilla FusATO, Collette Pescador A Pre-anesthesia Checklist: Patient identified, Emergency Drugs available, Suction available, Patient being monitored and Timeout performed Patient Re-evaluated:Patient Re-evaluated prior to inductionOxygen Delivery Method: Circle system utilized Preoxygenation: Pre-oxygenation with 100% oxygen Intubation Type: IV induction Ventilation: Mask ventilation without difficulty and Oral airway inserted - appropriate to patient size Laryngoscope Size: Mac and 3 Grade View: Grade II Tube type: Oral Tube size: 7.0 mm Number of attempts: 1 Airway Equipment and Method: Stylet Placement Confirmation: ETT inserted through vocal cords under direct vision and positive ETCO2 Secured at: 21 cm Tube secured with: Tape Dental Injury: Teeth and Oropharynx as per pre-operative assessment

## 2014-12-24 NOTE — Evaluation (Signed)
Physical Therapy Evaluation Patient Details Name: [name] MRN: [id number] DOB: 29-Jul-1941 Today's Date: 12/24/2014   History of Present Illness  74 y.o. female s/p Rt TKA.  Clinical Impression  Pt is s/p Rt TKA, presenting with the deficits listed below (see PT Problem List). Very pleasant and motivated individual, eager to work with therapy to regain her independence. Tolerated 15 feet of gait training today with min assist and no evidence of Rt knee buckling. Reviewed knee precautions and therapeutic exercises. She lives alone and reports she has arranged care at [facility] for rehabilitation prior to admission. Pt will benefit from skilled PT to increase their independence and safety with mobility to allow discharge to the venue listed below.      Follow Up Recommendations SNF    Equipment Recommendations  None recommended by PT    Recommendations for Other Services       Precautions / Restrictions Precautions Precautions: Knee Precaution Booklet Issued: Yes (comment) Precaution Comments: Reviewed precautions and exercises Required Braces or Orthoses: Knee Immobilizer - Right Knee Immobilizer - Right: On when out of bed or walking Restrictions Weight Bearing Restrictions: Yes RLE Weight Bearing: Weight bearing as tolerated      Mobility  Bed Mobility Overal bed mobility: Needs Assistance Bed Mobility: Supine to Sit     Supine to sit: Min guard;HOB elevated     General bed mobility comments: Min guard for safety. HOB elevated, with cues for technique. requires extra time.  Transfers Overall transfer level: Needs assistance Equipment used: Rolling walker (2 wheeled) Transfers: Sit to/from Stand Sit to Stand: Min assist         General transfer comment: Min assist for boost to stand from lowest bed setting. VC for hand placement. Decreased WB through RLE.  Ambulation/Gait Ambulation/Gait assistance: Min assist Ambulation Distance (Feet): 15  Feet Assistive device: Rolling walker (2 wheeled) Gait Pattern/deviations: Step-to pattern;Decreased step length - left;Decreased stance time - right;Antalgic;Decreased stride length;Trunk flexed Gait velocity: decreased   General Gait Details: VC for sequencing of gait and for upright posture. No buckling noted during ambulatory bout. Educated on safe DME use with rolling walker. Pt slightly nauseous, resolved upon sitting. Min assist for walker placement.  Stairs            Wheelchair Mobility    Modified Rankin (Stroke Patients Only)       Balance Overall balance assessment: Needs assistance Sitting-balance support: No upper extremity supported;Feet supported Sitting balance-Leahy Scale: Good     Standing balance support: Single extremity supported Standing balance-Leahy Scale: Poor                               Pertinent Vitals/Pain Pain Assessment: No/denies pain    Home Living Family/patient expects to be discharged to:: Skilled nursing facility [employer] living) Living Arrangements: Alone Available Help at Discharge: Neighbor Type of Home: House Home Access: Stairs to enter Entrance Stairs-Rails: Doctor, general practice of Steps: 5 Home Layout: One level Home Equipment: Cane - single point;Walker - 2 wheels      Prior Function Level of Independence: Independent with assistive device(s)         Comments: cane for mobility     Hand Dominance   Dominant Hand: Left    Extremity/Trunk Assessment   Upper Extremity Assessment: Defer to OT evaluation           Lower Extremity Assessment: RLE deficits/detail RLE Deficits /  Details: decreased strength and ROM as expected post op       Communication   Communication: No difficulties  Cognition Arousal/Alertness: Awake/alert Behavior During Therapy: WFL for tasks assessed/performed Overall Cognitive Status: Within Functional Limits for tasks assessed                       General Comments General comments (skin integrity, edema, etc.): Pt very independent and eager to perform tasks on her own as much as possible. Very motivated.    Exercises Total Joint Exercises Ankle Circles/Pumps: AROM;Both;15 reps;Seated Quad Sets: AROM;Both;10 reps;Seated Long Arc Quad: Strengthening;Right;10 reps;Seated      Assessment/Plan    PT Assessment Patient needs continued PT services  PT Diagnosis Difficulty walking;Abnormality of gait;Acute pain   PT Problem List Decreased strength;Decreased range of motion;Decreased activity tolerance;Decreased balance;Decreased mobility;Decreased knowledge of use of DME;Decreased knowledge of precautions;Pain  PT Treatment Interventions DME instruction;Gait training;Stair training;Functional mobility training;Therapeutic activities;Therapeutic exercise;Balance training;Neuromuscular re-education;Patient/family education;Modalities   PT Goals (Current goals can be found in the Care Plan section) Acute Rehab PT Goals Patient Stated Goal: [facility] PT Goal Formulation: With patient Time For Goal Achievement: 12/31/14 Potential to Achieve Goals: Good    Frequency 7X/week   Barriers to discharge Decreased caregiver support lives alone    Co-evaluation               End of Session Equipment Utilized During Treatment: Gait belt Activity Tolerance: Patient tolerated treatment well Patient left: in chair;with call bell/phone within reach Nurse Communication: Mobility status         Time: [id number] PT Time Calculation (min) (ACUTE ONLY): 24 min   Charges:   PT Evaluation $Initial PT Evaluation Tier I: 1 Procedure PT Treatments $Therapeutic Exercise: 8-22 mins   PT G Codes:        [place], Aceyn Kathol S 12/24/2014, 4:28 PM Sunday SpillersLogan Sec[place], [place] [phone]

## 2014-12-25 LAB — BASIC METABOLIC PANEL
Anion gap: 11 (ref 5–15)
BUN: 17 mg/dL (ref 6–23)
CO2: 24 mmol/L (ref 19–32)
CREATININE: 0.88 mg/dL (ref 0.50–1.10)
Calcium: 8.6 mg/dL (ref 8.4–10.5)
Chloride: 104 mmol/L (ref 96–112)
GFR calc Af Amer: 75 mL/min — ABNORMAL LOW (ref >90)
GFR, EST NON AFRICAN AMERICAN: 65 mL/min — AB (ref >90)
Glucose, Bld: 170 mg/dL — ABNORMAL HIGH (ref 70–99)
Potassium: 4.2 mmol/L (ref 3.5–5.1)
Sodium: 139 mmol/L (ref 135–145)

## 2014-12-25 LAB — CBC
HEMATOCRIT: 41.2 % (ref 36.0–46.0)
HEMOGLOBIN: 13.5 g/dL (ref 12.0–15.0)
MCH: 31 pg (ref 26.0–34.0)
MCHC: 32.8 g/dL (ref 30.0–36.0)
MCV: 94.7 fL (ref 78.0–100.0)
Platelets: 163 10*3/uL (ref 150–400)
RBC: 4.35 MIL/uL (ref 3.87–5.11)
RDW: 13 % (ref 11.5–15.5)
WBC: 10.8 10*3/uL — AB (ref 4.0–10.5)

## 2014-12-25 MED ORDER — SODIUM CHLORIDE 0.9 % IJ SOLN: 3.0000 mL | INTRAMUSCULAR | Status: DC | PRN

## 2014-12-25 MED ORDER — SODIUM CHLORIDE 0.9 % IJ SOLN
3.0000 mL | Freq: Two times a day (BID) | INTRAMUSCULAR | Status: DC
  Administered 2014-12-25 – 2014-12-26 (×2): 3 mL via INTRAVENOUS

## 2014-12-25 NOTE — Clinical Social Work Note (Signed)
Clinical Social Work Assessment  Patient Details  Name: Diamond White MRN: [id number] Date of Birth: 06/29/1941  Date of referral:  12/25/14               Reason for consult:  Discharge Planning, Facility Placement                Permission sought to share information with:  Facility Sport and exercise psychologist, Family Supports Permission granted to share information::  Yes, Verbal Permission Granted  Name::     Diamond White    [phone]  Agency::  Can share with [facility].   Relationship::  Sister   Contact Information:  see above   Housing/Transportation Living arrangements for the past 2 months:  [facility] of Information:  Patient Patient Interpreter Needed:  None Criminal Activity/Legal Involvement Pertinent to Current Situation/Hospitalization:  No - Comment as needed Significant Relationships:  Friend, [employer], Siblings Lives with:  Self Do you feel safe going back to the place where you live?  Yes Need for family participation in patient care:  No (Coment) (Only if needed. )  Care giving concerns:  Pt lives alone    Facilities manager / plan: CSW met with the patient at the bedside. CSW introduced self and explained reason for visit. CSW explained PT rec for SNF placement. Pt was understanding and stated that she already has a placed picked out. Pt stated that she worked for [employer] and she would like to be placed in that facility. CSW suggested that Pt choose an additional facility in the event that there are no beds available. Pt stated that she had spoke with the facility and Pt stated "they have beds available because Pt looked at facility [facility].  CSW was given permission to only send to [facility] at this time. CSW will speak with Pt as soon as bed offer comes in.   Employment status:  Disabled (Comment on whether or not currently receiving Disability) Insurance information:  Medicare, Other (Comment Required) Scientist, clinical (histocompatibility and immunogenetics) ) PT  Recommendations:  [place] / Referral to community resources:  [place]  Patient/Family's Response to care: Pt was pleasant and appreciative of assistance with d/c planning.   Patient/Family's Understanding of and Emotional Response to Diagnosis, Current Treatment, and Prognosis: Pt does have some anxiety surrounding bed placement.    Emotional Assessment Appearance:  Appears stated age, Well-Groomed Attitude/Demeanor/Rapport:    Affect (typically observed):  Accepting, Appropriate, Calm, Pleasant Orientation:  Oriented to Self, Oriented to Place, Oriented to  Time, Oriented to Situation Alcohol / Substance use:  Never Used Psych involvement (Current and /or in the community):  No (Comment)  Discharge Needs  Concerns to be addressed:  No discharge needs identified, Discharge Planning Concerns Readmission within the last 30 days:  No Current discharge risk:  None Barriers to Discharge:  No Barriers Identified   Pete Pelt 12/25/2014, 11:41 AM

## 2014-12-25 NOTE — Clinical Social Work Placement (Addendum)
   CLINICAL SOCIAL WORK PLACEMENT  NOTE  Date:  12/25/2014  Patient Details:        Please sign FL2    Name: Diamond White MRN: [id number] Date of Birth: 05-10-1941  Clinical Social Work is seeking post-discharge placement for this patient at the Skilled  Nursing Facility level of care (*CSW will initial, date and re-position this form in  chart as items are completed):  Yes   Patient/family provided with [place] Clinical Social Work Department's list of facilities offering this level of care within the geographic area requested by the patient (or if unable, by the patient's family).  Yes   Patient/family informed of their freedom to choose among providers that offer the needed level of care, that participate in Medicare, Medicaid or managed care program needed by the patient, have an available bed and are willing to accept the patient.  Yes   Patient/family informed of [place]'s ownership interest [facility] and [facility], as well as of the fact that they are under no obligation to receive care at these facilities.  PASRR submitted to EDS on 12/25/14     PASRR number received on 12/25/14     Existing PASRR number confirmed on       FL2 transmitted to all facilities in geographic area requested by pt/family on 12/25/14     FL2 transmitted to all facilities within larger geographic area on       Patient informed that his/her managed care company has contracts with or will negotiate with certain facilities, including the following:            Patient/family informed of bed offers received. - patient is a bundle- MD office assisted with arrangements   [facility]    Physician recommends and patient chooses bed at     n/a Patient to be transferred to   [facility] on   12/27/2014  Patient to be transferred to facility by     family  Patient family notified on   12/27/2014 of transfer.  Name  of family member notified:    family was at bedside and will assist with transportation.  Patient is alert and oriented x 4.    PHYSICIAN       Additional Comment:    _______________________________________________ Leron CroakAllen, Cassandra 12/25/2014, 12:09 PM

## 2014-12-25 NOTE — Progress Notes (Signed)
Physical Therapy Treatment Patient Details Name: Diamond LainMartha G White MRN: [id number] DOB: 07/26/1941 Today's Date: 12/25/2014    History of Present Illness 74 y.o. female s/p Rt TKA.    PT Comments    Progressing well. Will continue to follow. Pt very interested in short term SNF as she lives alone and will need to be as independent as possible prior to returning home.  Follow Up Recommendations  SNF     Equipment Recommendations  None recommended by PT    Recommendations for Other Services       Precautions / Restrictions Precautions Precautions: Knee Precaution Booklet Issued: Yes (comment) Precaution Comments: Reviewed precautions and exercises Required Braces or Orthoses: Knee Immobilizer - Right Knee Immobilizer - Right: On when out of bed or walking Restrictions Weight Bearing Restrictions: Yes RLE Weight Bearing: Weight bearing as tolerated    Mobility  Bed Mobility Overal bed mobility: Needs Assistance Bed Mobility: Supine to Sit     Supine to sit: Min guard;HOB elevated        Transfers Overall transfer level: Needs assistance Equipment used: Rolling walker (2 wheeled) Transfers: Sit to/from Stand Sit to Stand: Min assist         General transfer comment: Cues for safest technique  Ambulation/Gait Ambulation/Gait assistance: Min assist Ambulation Distance (Feet): 60 Feet Assistive device: Rolling walker (2 wheeled) Gait Pattern/deviations: Step-to pattern;Decreased stride length Gait velocity: decreased Gait velocity interpretation: Below normal speed for age/gender     Stairs            Wheelchair Mobility    Modified Rankin (Stroke Patients Only)       Balance                                    Cognition Arousal/Alertness: Awake/alert Behavior During Therapy: WFL for tasks assessed/performed Overall Cognitive Status: Within Functional Limits for tasks assessed                      Exercises Total  Joint Exercises Ankle Circles/Pumps: AROM;Both;10 reps;Supine Quad Sets: AROM;10 reps;Supine;Right Short Arc QuadBarbaraann Boys: AAROM;Right;10 reps;Supine Heel Slides: AAROM;Right;10 reps;Supine Hip ABduction/ADduction: AAROM;Right;10 reps;Supine Straight Leg Raises: AAROM;Right;10 reps;Supine Long Arc Quad: Strengthening;Right;10 reps;Seated Knee Flexion: AAROM;Right;5 reps;Seated Goniometric ROM: 83 flexion    General Comments General comments (skin integrity, edema, etc.): No family present.       Pertinent Vitals/Pain      Home Living                      Prior Function            PT Goals (current goals can now be found in the care plan section) Acute Rehab PT Goals Patient Stated Goal: [facility] Progress towards PT goals: Progressing toward goals    Frequency  7X/week    PT Plan Current plan remains appropriate    Co-evaluation             End of Session Equipment Utilized During Treatment: Gait belt Activity Tolerance: Patient tolerated treatment well Patient left: in chair;with call bell/phone within reach     Time: 0935-1012 PT Time Calculation (min) (ACUTE ONLY): 37 min  Charges:  $Gait Training: 8-22 mins $Therapeutic Exercise: 8-22 mins                    G Codes:  Donnella Sham 12/25/2014, 1:27 PM  [place], [place]  [phone] 12/25/2014

## 2014-12-25 NOTE — Progress Notes (Signed)
Subjective: R TKA 12-24-14 Doing well this AM, awakened from sleep with my evaluation Tol PO solids/liquids   Objective: Vital signs in last 24 hours: Temp:  [97.7 F (36.5 C)-98.1 F (36.7 C)] 97.7 F (36.5 C) (04/30 0559) Pulse Rate:  [80-113] 81 (04/30 0559) Resp:  [14-22] 18 (04/30 0559) BP: (120-141)/(49-84) 120/66 mmHg (04/30 0559) SpO2:  [92 %-96 %] 92 % (04/30 0559)  Intake/Output from previous day: 04/29 0701 - 04/30 0700 In: 2710 [P.O.:120; I.V.:2490; IV Piggyback:100] Out: 345 [Drains:345] Intake/Output this shift: Total I/O In: 240 [P.O.:240] Out: -    Recent Labs  12/25/14 0402  HGB 13.5    Recent Labs  12/25/14 0402  WBC 10.8*  RBC 4.35  HCT 41.2  PLT 163    Recent Labs  12/25/14 0402  NA 139  K 4.2  CL 104  CO2 24  BUN 17  CREATININE 0.88  GLUCOSE 170*  CALCIUM 8.6   No results for input(s): LABPT, INR in the last 72 hours.  ABD soft Sensation intact distally Intact pulses distally Dorsiflexion/Plantar flexion intact Compartment soft dressing clean/dry  Assessment/Plan: HV d/c'd Continue TKA protocol with planned SNF d/c on Monday.   Jahlia Omura A. 12/25/2014, 9:03 AM

## 2014-12-26 LAB — CBC
HCT: 38.8 % (ref 36.0–46.0)
Hemoglobin: 12.8 g/dL (ref 12.0–15.0)
MCH: 31.1 pg (ref 26.0–34.0)
MCHC: 33 g/dL (ref 30.0–36.0)
MCV: 94.2 fL (ref 78.0–100.0)
PLATELETS: 163 10*3/uL (ref 150–400)
RBC: 4.12 MIL/uL (ref 3.87–5.11)
RDW: 13.2 % (ref 11.5–15.5)
WBC: 18.2 10*3/uL — AB (ref 4.0–10.5)

## 2014-12-26 NOTE — Op Note (Signed)
NAMLucia White:  White, Diamond             ACCOUNT NO.:  [id number]  MEDICAL RECORD NO.:  [id number]  LOCATION:  [id number]                        FACILITY:  [facility]  PHYSICIAN:  Diamond JuniorJohn L. Maribel White, M.D.   DATE OF BIRTH:  May 23, 1941  DATE OF PROCEDURE:  12/26/2014 DATE OF DISCHARGE:                              OPERATIVE REPORT   PREOPERATIVE DIAGNOSIS:  End-stage degenerative joint disease, right knee.  POSTOPERATIVE DIAGNOSES: 1. End-stage degenerative joint disease, right knee. 2. Tight lateral retinaculum.  PROCEDURE:  Right total knee replacement with Sigma system size 3 femur, size 3 tibia, 10 mm bridging bearing, and a 35 mm all polyethylene patella, lateral retinacular release, postimplantation of components.  SURGEON:  Diamond JuniorJohn L. Diamond White, M.D.  ASSISTANT:  Marshia LyJames Bethune, PA-C.  ANESTHESIA:  General.  BRIEF HISTORY:  [name] is a 74 year old female with long history of significant complaints and she was treated with left total knee replacement with a computer-assisted technique some years ago.  She did great with that.  She did undergo a lateral retinacular release at that time.  She has continued to have progressive arthritis in her right knee and after failure of all conservative care including injection therapy, activity modification, arthroscopy, she was taken to the operating room for right total knee replacement with a history of significant night pain and light activity pain.  DESCRIPTION OF PROCEDURE:  The patient was taken to the operating room. After adequate anesthesia was obtained with general anesthetic, the patient was placed supine on the operating table.  The right leg was prepped and draped in usual sterile fashion.  Following this, the leg was exsanguinated.  Blood pressure inflated to 300 mmHg.  Following this, midline incision was made with subcutaneous tissue down the level of the extensor mechanism and medial parapatellar arthrotomy was undertaken.  Once this  was done, medial and lateral meniscus were removed, retropatellar fat pad and synovium in the anterior aspect of the femur and anterior-posterior cruciate.  Following this, attention turned to the femur, __________ pilot hole was drilled.  A 5 degree distal valgus alignment cut was made with an 11 mm of distal obstructing bone.  Following this, attention was turned towards the sizing of the femur, sized to a 3.  Anterior and posterior cuts were made, chamfers and box.  Attention was then turned to the tibia, was cut perpendicular to its long axis and then once this was done, it was drilled and keeled with rotational alignment set __________.  Following this, trial components were placed and the attention was turned to the patella, was cut down to a level 13 mm and 35 paddle was chosen and with __________ and lateralization, the patella button was placed.  At this point, a trial range of motion was undertaken.  There was significant tightness in the lateral retinaculum and lateral tendency of patella subluxate. At this point, we did a lateral retinacular release which allowed the patella tracking midline.  Once this was done, attention was then turned back towards removal of trial components and the knee was then copiously and thoroughly lavaged with pulsatile lavage irrigation and suctioned dry.  __________ final components were cemented in place size 3 tibia, size 2 femur,  10 mm __________ trial was placed and 35 all poly patella was placed and held with a clamp.  All excess bone cement was removed and at this time, while the cement was hardening had 20 mL of Exparel mixed with 20 mL of Marcaine instilled in and around the __________ postoperative anesthesia pain relief.  Following this, the cement was allowed to harden.  All excess bone cement removed.  The tourniquet let down.  All bleeding was controlled with electrocautery and the final __________ and placed.  The trial reduction was  now undertaken again with full range of motion.  No instability.  At this point, the medial parapatellar arthrotomy was closed with #1 Vicryl running, skin with 2-0 Vicryl, and a 3-0 Monocryl subcuticular closure was performed.  Benzoin and Steri-Strips were applied.  Sterile bulky dressing.  The patient was taken to the recovery room, where she was noted to be in satisfactory condition.  The estimated blood loss for the procedure would be minimal.     Diamond White, M.D.     Ranae Plumber  D:  12/26/2014  T:  12/26/2014  Job:  [id number]

## 2014-12-26 NOTE — Progress Notes (Signed)
CSW spoke with Pt on 4/30 and provided bed offer. Pt can d/c Sunday.    Pt concerned about insurance however Pt has a Bundle plan with Medicare and can d/c prior to the three night stay.     Leron Croakassandra Jewels [facility]  [facility]  2S, 69M, 5N, 6N, 6E, PEDS/PICU [phone]

## 2014-12-26 NOTE — Brief Op Note (Signed)
12/24/2014  6:30 PM  PATIENT:  Diamond White  74 y.o. female  PRE-OPERATIVE DIAGNOSIS:  DEGENERATIVE JOINT DISEASE RIGHT KNEE  POST-OPERATIVE DIAGNOSIS:  DEGENERATIVE JOINT DISEASE RIGHT KNEE  PROCEDURE:  Procedure(s): TOTAL KNEE ARTHROPLASTY (Right)  SURGEON:  Surgeon(s) and Role:    * Jodi GeraldsJohn Antar Milks, MD - Primary  PHYSICIAN ASSISTANT:   ASSISTANTS: bethune   ANESTHESIA:   general  EBL:  Total I/O In: 480 [P.O.:480] Out: -   BLOOD ADMINISTERED:none  DRAINS: (1) Hemovact drain(s) in the r knee with  Suction Open   LOCAL MEDICATIONS USED:  MARCAINE     SPECIMEN:  No Specimen  DISPOSITION OF SPECIMEN:  N/A  COUNTS:  YES  TOURNIQUET:   Total Tourniquet Time Documented: Thigh (Right) - 63 minutes Total: Thigh (Right) - 63 minutes   DICTATION: .Other Dictation: Dictation Number [id number]  PLAN OF CARE: Admit to inpatient   PATIENT DISPOSITION:  PACU - hemodynamically stable.   Delay start of Pharmacological VTE agent (>24hrs) due to surgical blood loss or risk of bleeding: no

## 2014-12-26 NOTE — Progress Notes (Signed)
Physical Therapy Treatment Patient Details Name: Diamond White MRN: [id number] DOB: [birth year] Today's Date: 12/26/2014    History of Present Illness 74 y.o. female s/p Rt TKA.    PT Comments    Making good progress.  Continue to recommend SNF for ongoing Physical Therapy as pt will not have support at home and is not yet at a modified independent level.     Follow Up Recommendations  SNF     Equipment Recommendations  None recommended by PT    Recommendations for Other Services       Precautions / Restrictions Precautions Precautions: Knee Precaution Booklet Issued: Yes (comment) Precaution Comments: Reviewed precautions and exercises Required Braces or Orthoses: Knee Immobilizer - Right Knee Immobilizer - Right: On when out of bed or walking Restrictions Weight Bearing Restrictions: Yes RLE Weight Bearing: Weight bearing as tolerated    Mobility  Bed Mobility Overal bed mobility: Needs Assistance Bed Mobility: Supine to Sit     Supine to sit: Min guard;HOB elevated        Transfers Overall transfer level: Needs assistance Equipment used: Rolling walker (2 wheeled) Transfers: Sit to/from Stand Sit to Stand: Min guard         General transfer comment: Cues for safest technique, including proper hand placement  Ambulation/Gait Ambulation/Gait assistance: Min guard Ambulation Distance (Feet): 90 Feet Assistive device: Rolling walker (2 wheeled) Gait Pattern/deviations: Step-to pattern;Decreased stride length Gait velocity: decreased Gait velocity interpretation: Below normal speed for age/gender General Gait Details: Cues for saferst technique   Stairs            Wheelchair Mobility    Modified Rankin (Stroke Patients Only)       Balance                                    Cognition Arousal/Alertness: Awake/alert Behavior During Therapy: WFL for tasks assessed/performed Overall Cognitive Status: Within Functional  Limits for tasks assessed                      Exercises Total Joint Exercises Ankle Circles/Pumps: AROM;Both;10 reps;Supine Quad Sets: AROM;10 reps;Supine;Right Short Arc Quad: Right;10 reps;Supine;AROM Heel Slides: AAROM;Right;10 reps;Supine Hip ABduction/ADduction: Right;10 reps;Supine;AROM Straight Leg Raises: Right;10 reps;Supine;AROM Long [employer]: Strengthening;Right;10 reps;Seated;AROM Knee Flexion: AAROM;Right;5 reps;Seated Goniometric ROM: 90 degrees flwion in sitting AAROM    General Comments General comments (skin integrity, edema, etc.): Pt tearful about missing her dog.       Pertinent Vitals/Pain Pain Assessment: 0-10 Pain Score: 7  Pain Location: r knee Pain Intervention(s): Limited activity within patient's tolerance;Monitored during session;Other (comment) (Pt reports she does not want pain meds at this time)    Home Living                      Prior Function            PT Goals (current goals can now be found in the care plan section) Acute Rehab PT Goals Patient Stated Goal: Go to [facility] Progress towards PT goals: Progressing toward goals    Frequency  7X/week    PT Plan Current plan remains appropriate    Co-evaluation             End of Session Equipment Utilized During Treatment: Gait belt Activity Tolerance: Patient tolerated treatment well Patient left: in chair;with call bell/phone within reach  Time: [id number] PT Time Calculation (min) (ACUTE ONLY): 26 min  Charges:  $Gait Training: 8-22 mins $Therapeutic Exercise: 8-22 mins                    G Codes:      Donnella ShamSawulski, Batul Diego J 12/26/2014, 10:15 AM  Lavona MoundMark Jermiah Howton, PT  [phone] 12/26/2014

## 2014-12-26 NOTE — Progress Notes (Signed)
0300 pt was awakened by another patient in room across the hall. She became very angry and was still angry 30 minutes later. She is refusing to take any medications and states she just wants to leave for the rehab center.

## 2014-12-26 NOTE — Progress Notes (Signed)
Subjective: R TKA 12-24-14 Doing well this AM, sitting up, awaiting breakfast, just finished CPM Tol PO solids/liquids   Objective: Vital signs in last 24 hours: Temp:  [97.7 F (36.5 C)-98.2 F (36.8 C)] 97.7 F (36.5 C) (05/01 0619) Pulse Rate:  [68-107] 68 (05/01 0619) Resp:  [18] 18 (05/01 0619) BP: (113-123)/(59-65) 119/65 mmHg (05/01 0619) SpO2:  [91 %-92 %] 92 % (05/01 0619) Weight:  [97.977 kg (216 lb)] 97.977 kg (216 lb) (04/30 2206)  Intake/Output from previous day: 04/30 0701 - 05/01 0700 In: 960 [P.O.:960] Out: -  Intake/Output this shift:     Recent Labs  12/25/14 0402  HGB 13.5    Recent Labs  12/25/14 0402  WBC 10.8*  RBC 4.35  HCT 41.2  PLT 163    Recent Labs  12/25/14 0402  NA 139  K 4.2  CL 104  CO2 24  BUN 17  CREATININE 0.88  GLUCOSE 170*  CALCIUM 8.6   No results for input(s): LABPT, INR in the last 72 hours.  ABD soft Sensation intact distally Intact pulses distally Dorsiflexion/Plantar flexion intact Compartment soft dressing clean/dry  Assessment/Plan: Continue TKA protocol with planned SNF d/c on Monday, provided bed available   Arthella Headings A. 12/26/2014, 7:20 AM

## 2014-12-27 ENCOUNTER — Encounter (HOSPITAL_COMMUNITY): Payer: Self-pay | Admitting: Orthopedic Surgery

## 2014-12-27 LAB — CBC
HCT: 40.1 % (ref 36.0–46.0)
Hemoglobin: 12.8 g/dL (ref 12.0–15.0)
MCH: 30.5 pg (ref 26.0–34.0)
MCHC: 31.9 g/dL (ref 30.0–36.0)
MCV: 95.5 fL (ref 78.0–100.0)
PLATELETS: 150 10*3/uL (ref 150–400)
RBC: 4.2 MIL/uL (ref 3.87–5.11)
RDW: 13.4 % (ref 11.5–15.5)
WBC: 11.7 10*3/uL — ABNORMAL HIGH (ref 4.0–10.5)

## 2014-12-27 NOTE — Progress Notes (Signed)
Physical Therapy Treatment Patient Details Name: Diamond LainMartha G White MRN: [id number] DOB: 1940-11-09 Today's Date: 12/27/2014    History of Present Illness 74 y.o. female s/p Rt TKA.    PT Comments    Pt is making progress toward goals and increasing functional independence. Plans to D/C to SNF later today.  Follow Up Recommendations  SNF     Equipment Recommendations  None recommended by PT    Recommendations for Other Services       Precautions / Restrictions Precautions Precautions: Knee Precaution Comments: Reviewed precautions and exercises Restrictions RLE Weight Bearing: Weight bearing as tolerated    Mobility  Bed Mobility               General bed mobility comments: Pt in chair before and after.  Transfers Overall transfer level: Needs assistance Equipment used: Rolling walker (2 wheeled) Transfers: Sit to/from Stand Sit to Stand: Supervision         General transfer comment: Supervision for safety.  Ambulation/Gait Ambulation/Gait assistance: Min guard Ambulation Distance (Feet): 100 Feet Assistive device: Rolling walker (2 wheeled) Gait Pattern/deviations: Step-through pattern;Decreased stride length;Decreased stance time - right   Gait velocity interpretation: Below normal speed for age/gender General Gait Details: Cues for upright posture/forward head. Able to ambulate without KI, and no knee buckling noted.   Stairs            Wheelchair Mobility    Modified Rankin (Stroke Patients Only)       Balance                                    Cognition Arousal/Alertness: Awake/alert Behavior During Therapy: WFL for tasks assessed/performed Overall Cognitive Status: Within Functional Limits for tasks assessed                      Exercises Total Joint Exercises Quad Sets: AROM;Right;15 reps Heel Slides: AAROM;Right;10 reps Hip ABduction/ADduction: AROM;Right;10 reps Straight Leg Raises: AAROM;Right;10  reps Long Arc Quad: AROM;Right;15 reps    General Comments        Pertinent Vitals/Pain Pain Assessment: 0-10 Pain Score: 7  Pain Location: R knee Pain Descriptors / Indicators: Sore Pain Intervention(s): Monitored during session    Home Living                      Prior Function            PT Goals (current goals can now be found in the care plan section) Progress towards PT goals: Progressing toward goals    Frequency  7X/week    PT Plan Current plan remains appropriate    Co-evaluation             End of Session Equipment Utilized During Treatment: Gait belt Activity Tolerance: Patient tolerated treatment well Patient left: in chair;with call bell/phone within reach     Time: 0802-0823 PT Time Calculation (min) (ACUTE ONLY): 21 min  Charges:                       G CodesLeonard Schwartz:      Jream Broyles, SPTA 12/27/2014, 8:32 AM

## 2014-12-27 NOTE — Discharge Planning (Addendum)
Patient will discharge today per MD order. [facility] RN to call report prior to transportation to: [phone] Transportation: family (per SNF can be transported at 10:30am)  CSW sent discharge summary to SNF for review.  Packet is complete.  RN, patient and family aware of discharge plans.  DC summary was sent to SNF for review and discharge time of 10:30am was given for CSW to give okay for family to transport.  Vickii PennaGina Sabel Hornbeck, LCSWA [phone]  Psychiatric & Orthopedics (5N 1-16) Clinical Social Worker

## 2014-12-27 NOTE — Discharge Summary (Signed)
Patient ID: Diamond LainMartha G Florence MRN: [id number] DOB/AGE: 25-Feb-1941 74 y.o.  Admit date: 12/24/2014 Discharge date: 12/27/2014  Admission Diagnoses:  Principal Problem:   Primary osteoarthritis of right knee   Discharge Diagnoses:  Same  Past Medical History  Diagnosis Date  . Hypertension   . Arthritis   . PONV (postoperative nausea and vomiting)   . Hyperlipidemia     Surgeries: Procedure(s): Right TOTAL KNEE ARTHROPLASTY on 12/24/2014   Consultants:    Discharged Condition: Improved  Hospital Course: Diamond White is an 74 y.o. female who was admitted 12/24/2014 for operative treatment ofPrimary osteoarthritis of right knee. Patient has severe unremitting pain that affects sleep, daily activities, and work/hobbies. After pre-op clearance the patient was taken to the operating room on 12/24/2014 and underwent  Procedure(s): Right TOTAL KNEE ARTHROPLASTY.    Patient was given perioperative antibiotics: Anti-infectives    Start     Dose/Rate Route Frequency Ordered Stop   12/24/14 1530  ceFAZolin (ANCEF) IVPB 2 g/50 mL premix     2 g 100 mL/hr over 30 Minutes Intravenous Every 6 hours 12/24/14 1341 12/24/14 2221   12/24/14 0827  ceFAZolin (ANCEF) 2-3 GM-% IVPB SOLR    Comments:  Alferd ApaFaries, Bridget   : cabinet override      12/24/14 0827 12/24/14 2044   12/24/14 0823  ceFAZolin (ANCEF) IVPB 2 g/50 mL premix     2 g 100 mL/hr over 30 Minutes Intravenous On call to O.R. 12/24/14 [id number] 12/24/14 0930       Patient was given sequential compression devices, early ambulation, and chemoprophylaxis to prevent DVT. Her Hemovac drain was pulled on postoperative day #1. Her dressing was changed prior to discharge. Her wound was benign.  Patient benefited maximally from hospital stay and there were no complications.    Recent vital signs: Patient Vitals for the past 24 hrs:  BP Temp Temp src Pulse Resp SpO2  12/27/14 0550 131/72 mmHg 97.7 F (36.5 C) Oral 68 18 96 %  12/26/14 2050  125/60 mmHg 97.9 F (36.6 C) Oral 73 18 96 %     Recent laboratory studies:  Recent Labs  12/25/14 0402 12/26/14 0710 12/27/14 0543  WBC 10.8* 18.2* 11.7*  HGB 13.5 12.8 12.8  HCT 41.2 38.8 40.1  PLT 163 163 150  NA 139  --   --   K 4.2  --   --   CL 104  --   --   CO2 24  --   --   BUN 17  --   --   CREATININE 0.88  --   --   GLUCOSE 170*  --   --   CALCIUM 8.6  --   --      Discharge Medications:     Medication List    TAKE these medications        aspirin EC 325 MG tablet  Take 1 tablet (325 mg total) by mouth 2 (two) times daily after a meal. Take x 1 month post op to decrease risk of blood clots.     Calcium + D3 600-200 MG-UNIT Tabs  Take 1 tablet by mouth daily.     hydrochlorothiazide 12.5 MG tablet  Commonly known as:  HYDRODIURIL  Take 12.5 mg by mouth daily.     methocarbamol 750 MG tablet  Commonly known as:  ROBAXIN-750  Take 1 tablet (750 mg total) by mouth every 8 (eight) hours as needed for muscle spasms.     ondansetron  4 MG disintegrating tablet  Commonly known as:  ZOFRAN ODT  Take 1 tablet (4 mg total) by mouth every 8 (eight) hours as needed for nausea.     oxyCODONE-acetaminophen 5-325 MG per tablet  Commonly known as:  PERCOCET/ROXICET  Take 1-2 tablets by mouth every 4 (four) hours as needed.     simvastatin 20 MG tablet  Commonly known as:  ZOCOR  Take 20 mg by mouth every evening.        Diagnostic Studies: Dg Chest 2 View  12/13/2014   CLINICAL DATA:  Preop testing. History of hypertension, arthritis. Postop nausea, vomiting.  EXAM: CHEST  2 VIEW  COMPARISON:  04/19/2011  FINDINGS: Heart size is normal. Aorta is mildly tortuous. The lungs are clear. No pulmonary edema. There is moderate mid thoracic spondylosis.  IMPRESSION: No active cardiopulmonary disease.   Electronically Signed   By: Norva Pavlov M.D.   On: 12/13/2014 11:06    Disposition: Skilled nursing facility      Discharge Instructions    CPM    Complete  by:  As directed   Continuous passive motion machine (CPM):      Use the CPM from 0 to 60 for 8 hours per day.      You may increase by 5-10 per day.  You may break it up into 2 or 3 sessions per day.      Use CPM for 1-2 weeks or until you are told to stop.     Call MD / Call 911    Complete by:  As directed   If you experience chest pain or shortness of breath, CALL 911 and be transported to the hospital emergency room.  If you develope a fever above 101 F, pus (white drainage) or increased drainage or redness at the wound, or calf pain, call your surgeon's office.     Constipation Prevention    Complete by:  As directed   Drink plenty of fluids.  Prune juice may be helpful.  You may use a stool softener, such as Colace (over the counter) 100 mg twice a day.  Use MiraLax (over the counter) for constipation as needed.     Diet general    Complete by:  As directed      Do not put a pillow under the knee. Place it under the heel.    Complete by:  As directed      Follow the hip precautions as taught in Physical Therapy    Complete by:  As directed      Increase activity slowly as tolerated    Complete by:  As directed      Weight bearing as tolerated    Complete by:  As directed   Laterality:  right  Extremity:  Lower           Follow-up Information    Follow up with GRAVES,JOHN L, MD. Schedule an appointment as soon as possible for a visit in 2 weeks.   Specialty:  Orthopedic Surgery   Contact information:   [address] [place] [address] [phone]        Signed: Matthew Folks 12/27/2014, 8:15 AM

## 2014-12-27 NOTE — Anesthesia Postprocedure Evaluation (Signed)
Anesthesia Post Note  Patient: Diamond White  Procedure(s) Performed: Procedure(s) (LRB): TOTAL KNEE ARTHROPLASTY (Right)  Anesthesia type: General  Patient location: PACU  Post pain: Pain level controlled  Post assessment: Post-op Vital signs reviewed  Last Vitals: BP 131/72 mmHg  Pulse 68  Temp(Src) 36.5 C (Oral)  Resp 18  Ht 5\' 7"  (1.702 m)  Wt 216 lb (97.977 kg)  BMI 33.82 kg/m2  SpO2 96%  Post vital signs: Reviewed  Level of consciousness: sedated  Complications: No apparent anesthesia complications

## 2014-12-27 NOTE — Progress Notes (Signed)
Subjective: 3 Days Post-Op Procedure(s) (LRB): TOTAL KNEE ARTHROPLASTY (Right) Patient reports pain as moderate.  She is taking by mouth and voiding okay. Progressing with physical therapy  Objective: Vital signs in last 24 hours: Temp:  [97.7 F (36.5 C)-97.9 F (36.6 C)] 97.7 F (36.5 C) (05/02 0550) Pulse Rate:  [68-73] 68 (05/02 0550) Resp:  [18] 18 (05/02 0550) BP: (125-131)/(60-72) 131/72 mmHg (05/02 0550) SpO2:  [96 %] 96 % (05/02 0550)  Intake/Output from previous day: 05/01 0701 - 05/02 0700 In: 780 [P.O.:780] Out: -  Intake/Output this shift:     Recent Labs  12/25/14 0402 12/26/14 0710 12/27/14 0543  HGB 13.5 12.8 12.8    Recent Labs  12/26/14 0710 12/27/14 0543  WBC 18.2* 11.7*  RBC 4.12 4.20  HCT 38.8 40.1  PLT 163 150    Recent Labs  12/25/14 0402  NA 139  K 4.2  CL 104  CO2 24  BUN 17  CREATININE 0.88  GLUCOSE 170*  CALCIUM 8.6   No results for input(s): LABPT, INR in the last 72 hours. Right knee exam: Dressing is clean and dry. Calf is soft and nontender Neurovascular intact Sensation intact distally Intact pulses distally Dorsiflexion/Plantar flexion intact Incision: dressing C/D/I Compartment soft  Assessment/Plan: 3 Days Post-Op Procedure(s) (LRB): TOTAL KNEE ARTHROPLASTY (Right) Plan: Discharge to SNF Follow-up with Dr. Luiz BlareGraves in 10 days  Virat Prather G 12/27/2014, 8:11 AM

## 2014-12-28 ENCOUNTER — Non-Acute Institutional Stay (SKILLED_NURSING_FACILITY): Payer: Medicare Other | Admitting: Internal Medicine

## 2014-12-28 ENCOUNTER — Encounter: Payer: Self-pay | Admitting: Internal Medicine

## 2014-12-28 DIAGNOSIS — Z96651 Presence of right artificial knee joint: Secondary | ICD-10-CM | POA: Diagnosis not present

## 2014-12-28 DIAGNOSIS — I10 Essential (primary) hypertension: Secondary | ICD-10-CM | POA: Diagnosis not present

## 2014-12-28 DIAGNOSIS — M1711 Unilateral primary osteoarthritis, right knee: Secondary | ICD-10-CM | POA: Diagnosis not present

## 2014-12-28 DIAGNOSIS — R110 Nausea: Secondary | ICD-10-CM | POA: Diagnosis not present

## 2014-12-28 DIAGNOSIS — Z9889 Other specified postprocedural states: Secondary | ICD-10-CM

## 2014-12-28 DIAGNOSIS — R112 Nausea with vomiting, unspecified: Secondary | ICD-10-CM

## 2014-12-28 DIAGNOSIS — E785 Hyperlipidemia, unspecified: Secondary | ICD-10-CM | POA: Diagnosis not present

## 2014-12-28 NOTE — Progress Notes (Signed)
Patient ID: Diamond White, female   DOB: 1941/06/01, 74 y.o.   MRN: [id number]    HISTORY AND PHYSICAL   DATE: 12/28/14  Location:  [facility]    Place of Service: SNF [phone])   Extended Emergency Contact Information Primary Emergency Contact: Roylene Reason [place] Phone: [phone] Relation: Sister  Advanced Directive information  FULL CODE   Chief Complaint  Patient presents with  . New admit to SNF    HPI:  74 yo female seen today as a new admission into SNF following hospital stay for right TKR on 4/29th due to OA. She will see Dr Berenice Primas in 2 weeks. She is WBAT and receiving PT. ASA 344m BID for DVT prophylaxis. No N/V. Pain controlled on percocet and robaxin. No d/c from incision. No f/c. No falls. She is eating/sleeping well. No nursing issues. She is using her incentive spirometry several times a day.  BP stable on HCTZ.  She takes simvastatin for cholesterol  Past Medical History  Diagnosis Date  . Hypertension   . Arthritis   . PONV (postoperative nausea and vomiting)   . Hyperlipidemia     Past Surgical History  Procedure Laterality Date  . Appendectomy    . Replacement total knee    . Tonsillectomy    . Abdominal hysterectomy    . Total knee arthroplasty Right 12/24/2014    Procedure: TOTAL KNEE ARTHROPLASTY;  Surgeon: JDorna Leitz MD;  [place] [facility]  Service: Orthopedics;  Laterality: Right;    Patient Care Team: DAntony Contras MD as PCP - General (Family Medicine)  History   Social History  . Marital Status: Married    Spouse Name: N/A  . Number of Children: N/A  . Years of Education: N/A   Occupational History  . Not on file.   Social History Main Topics  . Smoking status: Never Smoker   . Smokeless tobacco: Never Used  . Alcohol Use: No  . Drug Use: Yes    Special: Oxycodone  . Sexual Activity: Not on file   Other Topics Concern  . Not on file   Social History Narrative     reports that she has never smoked. She has never used smokeless tobacco. She reports that she uses illicit drugs (Oxycodone). She reports that she does not drink alcohol.  No family history on file. No family status information on file.     There is no immunization history on file for this patient.  Allergies  Allergen Reactions  . Compazine [Prochlorperazine] Other (See Comments)    Oral swelling   . Phenothiazines Swelling and Other (See Comments)    Oral swelling    Medications: Patient's Medications  New Prescriptions   No medications on file  Previous Medications   ASPIRIN EC 325 MG TABLET    Take 1 tablet (325 mg total) by mouth 2 (two) times daily after a meal. Take x 1 month post op to decrease risk of blood clots.   CALCIUM CARB-CHOLECALCIFEROL (CALCIUM + D3) 600-200 MG-UNIT TABS    Take 1 tablet by mouth daily.   HYDROCHLOROTHIAZIDE (HYDRODIURIL) 12.5 MG TABLET    Take 12.5 mg by mouth daily.   METHOCARBAMOL (ROBAXIN-750) 750 MG TABLET    Take 1 tablet (750 mg total) by mouth every 8 (eight) hours as needed for muscle spasms.   ONDANSETRON (ZOFRAN ODT) 4 MG DISINTEGRATING TABLET    Take 1 tablet (4 mg total) by mouth every 8 (eight)  hours as needed for nausea.   OXYCODONE-ACETAMINOPHEN (PERCOCET/ROXICET) 5-325 MG PER TABLET    Take 1-2 tablets by mouth every 4 (four) hours as needed.   SIMVASTATIN (ZOCOR) 20 MG TABLET    Take 20 mg by mouth every evening.  Modified Medications   No medications on file  Discontinued Medications   No medications on file    Review of Systems  Constitutional: Negative for fever, chills, diaphoresis, activity change, appetite change and fatigue.  HENT: Negative for ear pain and sore throat.   Eyes: Negative for visual disturbance.  Respiratory: Negative for cough, chest tightness and shortness of breath.   Cardiovascular: Negative for chest pain, palpitations and leg swelling.  Gastrointestinal: Negative for nausea, vomiting, abdominal  pain, diarrhea, constipation and blood in stool.  Genitourinary: Negative for dysuria.  Musculoskeletal: Positive for joint swelling, arthralgias and gait problem.  Neurological: Negative for dizziness, tremors, numbness and headaches.  Psychiatric/Behavioral: Negative for sleep disturbance. The patient is not nervous/anxious.     Filed Vitals:   12/28/14 1611  BP: 141/86  Pulse: 90  Temp: 97.8 F (36.6 C)   There is no weight on file to calculate BMI.  Physical Exam  Constitutional: She is oriented to person, place, and time. She appears well-developed and well-nourished.  Sitting in w/c in NAD  HENT:  Mouth/Throat: Oropharynx is clear and moist. No oropharyngeal exudate.  Eyes: Pupils are equal, round, and reactive to light. No scleral icterus.  Neck: Neck supple. Carotid bruit is not present. No tracheal deviation present.  Cardiovascular: Normal rate, regular rhythm, normal heart sounds and intact distal pulses.  Exam reveals no gallop and no friction rub.   No murmur heard. RLE swelling noted but no LLE edema b/l. no calf TTP. Anterior right knee bandage intact and clean, dry.  Pulmonary/Chest: Effort normal and breath sounds normal. No stridor. No respiratory distress. She has no wheezes. She has no rales.  Abdominal: Soft. Bowel sounds are normal. She exhibits no distension and no mass. There is no hepatomegaly. There is no tenderness. There is no rebound and no guarding.  Musculoskeletal: She exhibits edema (right knee) and tenderness.  Lymphadenopathy:    She has no cervical adenopathy.  Neurological: She is alert and oriented to person, place, and time.  Skin: Skin is warm and dry. No rash noted.  Psychiatric: She has a normal mood and affect. Her behavior is normal. Thought content normal.     Labs reviewed: Admission on 12/24/2014, Discharged on 12/27/2014  Component Date Value Ref Range Status  . MRSA, PCR 12/13/2014 NEGATIVE  NEGATIVE Final  . Staphylococcus  aureus 12/13/2014 NEGATIVE  NEGATIVE Final   Comment:        The Xpert SA Assay (FDA approved for NASAL specimens in patients over 29 years of age), is one component of a comprehensive surveillance program.  Test performance has been validated by [facility] for patients greater than or equal to 51 year old. It is not intended to diagnose infection nor to guide or monitor treatment.   Marland Kitchen aPTT 12/13/2014 26  24 - 37 seconds Final  . WBC 12/13/2014 8.6  4.0 - 10.5 K/uL Final  . RBC 12/13/2014 5.00  3.87 - 5.11 MIL/uL Final  . Hemoglobin 12/13/2014 15.4* 12.0 - 15.0 g/dL Final  . HCT 12/13/2014 48.0* 36.0 - 46.0 % Final  . MCV 12/13/2014 96.0  78.0 - 100.0 fL Final  . MCH 12/13/2014 30.8  26.0 - 34.0 pg Final  . MCHC  12/13/2014 32.1  30.0 - 36.0 g/dL Final  . RDW 12/13/2014 13.0  11.5 - 15.5 % Final  . Platelets 12/13/2014 189  150 - 400 K/uL Final  . Neutrophils Relative % 12/13/2014 67  43 - 77 % Final  . Neutro Abs 12/13/2014 5.8  1.7 - 7.7 K/uL Final  . Lymphocytes Relative 12/13/2014 22  12 - 46 % Final  . Lymphs Abs 12/13/2014 1.8  0.7 - 4.0 K/uL Final  . Monocytes Relative 12/13/2014 8  3 - 12 % Final  . Monocytes Absolute 12/13/2014 0.6  0.1 - 1.0 K/uL Final  . Eosinophils Relative 12/13/2014 3  0 - 5 % Final  . Eosinophils Absolute 12/13/2014 0.3  0.0 - 0.7 K/uL Final  . Basophils Relative 12/13/2014 0  0 - 1 % Final  . Basophils Absolute 12/13/2014 0.0  0.0 - 0.1 K/uL Final  . Sodium 12/13/2014 142  135 - 145 mmol/L Final  . Potassium 12/13/2014 3.5  3.5 - 5.1 mmol/L Final  . Chloride 12/13/2014 106  96 - 112 mmol/L Final  . CO2 12/13/2014 24  19 - 32 mmol/L Final  . Glucose, Bld 12/13/2014 155* 70 - 99 mg/dL Final  . BUN 12/13/2014 16  6 - 23 mg/dL Final  . Creatinine, Ser 12/13/2014 0.88  0.50 - 1.10 mg/dL Final  . Calcium 12/13/2014 9.2  8.4 - 10.5 mg/dL Final  . Total Protein 12/13/2014 6.6  6.0 - 8.3 g/dL Final  . Albumin 12/13/2014 3.6  3.5 - 5.2 g/dL Final  .  AST 12/13/2014 28  0 - 37 U/L Final  . ALT 12/13/2014 21  0 - 35 U/L Final  . Alkaline Phosphatase 12/13/2014 80  39 - 117 U/L Final  . Total Bilirubin 12/13/2014 0.6  0.3 - 1.2 mg/dL Final  . GFR calc non Af Amer 12/13/2014 65* >90 mL/min Final  . GFR calc Af Amer 12/13/2014 75* >90 mL/min Final   Comment: (NOTE) The eGFR has been calculated using the CKD EPI equation. This calculation has not been validated in all clinical situations. eGFR's persistently <90 mL/min signify possible Chronic Kidney Disease.   . Anion gap 12/13/2014 12  5 - 15 Final  . Prothrombin Time 12/13/2014 13.2  11.6 - 15.2 seconds Final  . INR 12/13/2014 0.99  0.00 - 1.49 Final  . ABO/RH(D) 12/13/2014 O POS   Final  . Antibody Screen 12/13/2014 NEG   Final  . Sample Expiration 12/13/2014 12/27/2014   Final  . Color, Urine 12/13/2014 YELLOW  YELLOW Final   LESS THAN 10 mL OF URINE SUBMITTED  . APPearance 12/13/2014 CLEAR  CLEAR Final  . Specific Gravity, Urine 12/13/2014 1.021  1.005 - 1.030 Final  . pH 12/13/2014 5.0  5.0 - 8.0 Final  . Glucose, UA 12/13/2014 NEGATIVE  NEGATIVE mg/dL Final  . Hgb urine dipstick 12/13/2014 NEGATIVE  NEGATIVE Final  . Bilirubin Urine 12/13/2014 NEGATIVE  NEGATIVE Final  . Ketones, ur 12/13/2014 NEGATIVE  NEGATIVE mg/dL Final  . Protein, ur 12/13/2014 NEGATIVE  NEGATIVE mg/dL Final  . Urobilinogen, UA 12/13/2014 0.2  0.0 - 1.0 mg/dL Final  . Nitrite 12/13/2014 NEGATIVE  NEGATIVE Final  . Leukocytes, UA 12/13/2014 NEGATIVE  NEGATIVE Final   MICROSCOPIC NOT DONE ON URINES WITH NEGATIVE PROTEIN, BLOOD, LEUKOCYTES, NITRITE, OR GLUCOSE <1000 mg/dL.  . WBC 12/25/2014 10.8* 4.0 - 10.5 K/uL Final  . RBC 12/25/2014 4.35  3.87 - 5.11 MIL/uL Final  . Hemoglobin 12/25/2014 13.5  12.0 - 15.0  g/dL Final  . HCT 12/25/2014 41.2  36.0 - 46.0 % Final  . MCV 12/25/2014 94.7  78.0 - 100.0 fL Final  . MCH 12/25/2014 31.0  26.0 - 34.0 pg Final  . MCHC 12/25/2014 32.8  30.0 - 36.0 g/dL Final  .  RDW 12/25/2014 13.0  11.5 - 15.5 % Final  . Platelets 12/25/2014 163  150 - 400 K/uL Final  . Sodium 12/25/2014 139  135 - 145 mmol/L Final  . Potassium 12/25/2014 4.2  3.5 - 5.1 mmol/L Final  . Chloride 12/25/2014 104  96 - 112 mmol/L Final  . CO2 12/25/2014 24  19 - 32 mmol/L Final  . Glucose, Bld 12/25/2014 170* 70 - 99 mg/dL Final  . BUN 12/25/2014 17  6 - 23 mg/dL Final  . Creatinine, Ser 12/25/2014 0.88  0.50 - 1.10 mg/dL Final  . Calcium 12/25/2014 8.6  8.4 - 10.5 mg/dL Final  . GFR calc non Af Amer 12/25/2014 65* >90 mL/min Final  . GFR calc Af Amer 12/25/2014 75* >90 mL/min Final   Comment: (NOTE) The eGFR has been calculated using the CKD EPI equation. This calculation has not been validated in all clinical situations. eGFR's persistently <90 mL/min signify possible Chronic Kidney Disease.   . Anion gap 12/25/2014 11  5 - 15 Final  . WBC 12/26/2014 18.2* 4.0 - 10.5 K/uL Final  . RBC 12/26/2014 4.12  3.87 - 5.11 MIL/uL Final  . Hemoglobin 12/26/2014 12.8  12.0 - 15.0 g/dL Final  . HCT 12/26/2014 38.8  36.0 - 46.0 % Final  . MCV 12/26/2014 94.2  78.0 - 100.0 fL Final  . MCH 12/26/2014 31.1  26.0 - 34.0 pg Final  . MCHC 12/26/2014 33.0  30.0 - 36.0 g/dL Final  . RDW 12/26/2014 13.2  11.5 - 15.5 % Final  . Platelets 12/26/2014 163  150 - 400 K/uL Final  . WBC 12/27/2014 11.7* 4.0 - 10.5 K/uL Final  . RBC 12/27/2014 4.20  3.87 - 5.11 MIL/uL Final  . Hemoglobin 12/27/2014 12.8  12.0 - 15.0 g/dL Final  . HCT 12/27/2014 40.1  36.0 - 46.0 % Final  . MCV 12/27/2014 95.5  78.0 - 100.0 fL Final  . MCH 12/27/2014 30.5  26.0 - 34.0 pg Final  . MCHC 12/27/2014 31.9  30.0 - 36.0 g/dL Final  . RDW 12/27/2014 13.4  11.5 - 15.5 % Final  . Platelets 12/27/2014 150  150 - 400 K/uL Final  Admission on 11/13/2014, Discharged on 11/14/2014  Component Date Value Ref Range Status  . WBC 11/13/2014 8.1  4.0 - 10.5 K/uL Final  . RBC 11/13/2014 4.57  3.87 - 5.11 MIL/uL Final  . Hemoglobin  11/13/2014 14.4  12.0 - 15.0 g/dL Final  . HCT 11/13/2014 43.8  36.0 - 46.0 % Final  . MCV 11/13/2014 95.8  78.0 - 100.0 fL Final  . MCH 11/13/2014 31.5  26.0 - 34.0 pg Final  . MCHC 11/13/2014 32.9  30.0 - 36.0 g/dL Final  . RDW 11/13/2014 12.7  11.5 - 15.5 % Final  . Platelets 11/13/2014 182  150 - 400 K/uL Final  . Neutrophils Relative % 11/13/2014 85* 43 - 77 % Final  . Neutro Abs 11/13/2014 6.9  1.7 - 7.7 K/uL Final  . Lymphocytes Relative 11/13/2014 10* 12 - 46 % Final  . Lymphs Abs 11/13/2014 0.8  0.7 - 4.0 K/uL Final  . Monocytes Relative 11/13/2014 5  3 - 12 % Final  . Monocytes Absolute 11/13/2014  0.4  0.1 - 1.0 K/uL Final  . Eosinophils Relative 11/13/2014 0  0 - 5 % Final  . Eosinophils Absolute 11/13/2014 0.0  0.0 - 0.7 K/uL Final  . Basophils Relative 11/13/2014 0  0 - 1 % Final  . Basophils Absolute 11/13/2014 0.0  0.0 - 0.1 K/uL Final  . Sodium 11/13/2014 142  135 - 145 mmol/L Final  . Potassium 11/13/2014 4.0  3.5 - 5.1 mmol/L Final  . Chloride 11/13/2014 106  96 - 112 mmol/L Final  . CO2 11/13/2014 23  19 - 32 mmol/L Final  . Glucose, Bld 11/13/2014 174* 70 - 99 mg/dL Final  . BUN 11/13/2014 14  6 - 23 mg/dL Final  . Creatinine, Ser 11/13/2014 0.68  0.50 - 1.10 mg/dL Final  . Calcium 11/13/2014 9.1  8.4 - 10.5 mg/dL Final  . Total Protein 11/13/2014 6.7  6.0 - 8.3 g/dL Final  . Albumin 11/13/2014 3.9  3.5 - 5.2 g/dL Final  . AST 11/13/2014 22  0 - 37 U/L Final  . ALT 11/13/2014 18  0 - 35 U/L Final  . Alkaline Phosphatase 11/13/2014 78  39 - 117 U/L Final  . Total Bilirubin 11/13/2014 0.7  0.3 - 1.2 mg/dL Final  . GFR calc non Af Amer 11/13/2014 86* >90 mL/min Final  . GFR calc Af Amer 11/13/2014 >90  >90 mL/min Final   Comment: (NOTE) The eGFR has been calculated using the CKD EPI equation. This calculation has not been validated in all clinical situations. eGFR's persistently <90 mL/min signify possible Chronic Kidney Disease.   . Anion gap 11/13/2014 13  5  - 15 Final  . Lipase 11/13/2014 44  11 - 59 U/L Final  . Color, Urine 11/13/2014 YELLOW  YELLOW Final  . APPearance 11/13/2014 CLEAR  CLEAR Final  . Specific Gravity, Urine 11/13/2014 1.022  1.005 - 1.030 Final  . pH 11/13/2014 6.5  5.0 - 8.0 Final  . Glucose, UA 11/13/2014 NEGATIVE  NEGATIVE mg/dL Final  . Hgb urine dipstick 11/13/2014 NEGATIVE  NEGATIVE Final  . Bilirubin Urine 11/13/2014 NEGATIVE  NEGATIVE Final  . Ketones, ur 11/13/2014 40* NEGATIVE mg/dL Final  . Protein, ur 11/13/2014 NEGATIVE  NEGATIVE mg/dL Final  . Urobilinogen, UA 11/13/2014 1.0  0.0 - 1.0 mg/dL Final  . Nitrite 11/13/2014 NEGATIVE  NEGATIVE Final  . Leukocytes, UA 11/13/2014 NEGATIVE  NEGATIVE Final   MICROSCOPIC NOT DONE ON URINES WITH NEGATIVE PROTEIN, BLOOD, LEUKOCYTES, NITRITE, OR GLUCOSE <1000 mg/dL.    Dg Chest 2 View  12/13/2014   CLINICAL DATA:  Preop testing. History of hypertension, arthritis. Postop nausea, vomiting.  EXAM: CHEST  2 VIEW  COMPARISON:  04/19/2011  FINDINGS: Heart size is normal. Aorta is mildly tortuous. The lungs are clear. No pulmonary edema. There is moderate mid thoracic spondylosis.  IMPRESSION: No active cardiopulmonary disease.   Electronically Signed   By: Nolon Nations M.D.   On: 12/13/2014 11:06     Assessment/Plan   ICD-9-CM ICD-10-CM   1. Status post total right knee replacement - due to #2 V43.65 Z96.651   2. Primary osteoarthritis of right knee 715.16 M17.11   3. Benign essential HTN - stable 401.1 I10   4. Hyperlipidemia - stable 272.4 E78.5   5. PONV (postoperative nausea and vomiting) -resolved 787.01 R11.0    --PT as ordered. WBAT  --f/u with Ortho as scheduled in 2 weeks  --pain control as ordered  --GOAL: short term rehab and d/c home when medically appropriate.  Communicated with pt and nursing.   Chimaobi Casebolt S. Perlie Gold  [facility] [address] [place], [place] [address] [phone] Cell  (Monday-Friday 8 AM - 5 PM) [phone] After 5 PM and follow prompts

## 2014-12-31 ENCOUNTER — Non-Acute Institutional Stay (SKILLED_NURSING_FACILITY): Payer: Medicare Other | Admitting: Adult Health

## 2014-12-31 DIAGNOSIS — M1711 Unilateral primary osteoarthritis, right knee: Secondary | ICD-10-CM | POA: Diagnosis not present

## 2015-02-24 ENCOUNTER — Encounter: Payer: Self-pay | Admitting: Adult Health

## 2015-02-24 NOTE — Progress Notes (Signed)
Patient ID: Diamond White, female   DOB: 1941/04/19, 74 y.o.   MRN: [id number]  Diamond White living [place]     Allergies  Allergen Reactions  . Compazine [Prochlorperazine] Other (See Comments)    Oral swelling   . Phenothiazines Swelling and Other (See Comments)    Oral swelling       Chief Complaint  Patient presents with  . Discharge Note    HPI:  She is being discharged to home after being hospitalized for a right knee replacement. She will need home health for pt. She will a 3:1 commode. She will need her prescriptions to be written and a follow up with her pcp.    Past Medical History  Diagnosis Date  . Hypertension   . Arthritis   . PONV (postoperative nausea and vomiting)   . Hyperlipidemia     Past Surgical History  Procedure Laterality Date  . Appendectomy    . Replacement total knee    . Tonsillectomy    . Abdominal hysterectomy    . Total knee arthroplasty Right 12/24/2014    Procedure: TOTAL KNEE ARTHROPLASTY;  Surgeon: Jodi GeraldsJohn Graves, MD;  Location: [facility] OR;  Service: Orthopedics;  Laterality: Right;    VITAL SIGNS BP 143/66 mmHg  Pulse 99  Ht 5' 7.5" (1.715 m)  Wt 216 lb (97.977 kg)  BMI 33.31 kg/m2   Outpatient Encounter Prescriptions as of 12/31/2014  Medication Sig  . aspirin EC 325 MG tablet Take 1 tablet (325 mg total) by mouth 2 (two) times daily after a meal. Take x 1 month post op to decrease risk of blood clots.  . Calcium Carb-Cholecalciferol (CALCIUM + D3) 600-200 MG-UNIT TABS Take 1 tablet by mouth daily.  . hydrochlorothiazide (HYDRODIURIL) 12.5 MG tablet Take 12.5 mg by mouth daily.  . methocarbamol (ROBAXIN-750) 750 MG tablet Take 1 tablet (750 mg total) by mouth every 8 (eight) hours as needed for muscle spasms.  . ondansetron (ZOFRAN ODT) 4 MG disintegrating tablet Take 1 tablet (4 mg total) by mouth every 8 (eight) hours as needed for nausea.  Marland Kitchen. oxyCODONE-acetaminophen (PERCOCET/ROXICET) 5-325 MG per tablet Take 1-2 tablets by  mouth every 4 (four) hours as needed.  . simvastatin (ZOCOR) 20 MG tablet Take 20 mg by mouth every evening.   No facility-administered encounter medications on file as of 12/31/2014.     SIGNIFICANT DIAGNOSTIC EXAMS    Review of Systems  Constitutional: Negative for malaise/fatigue.  Respiratory: Negative for cough and shortness of breath.   Cardiovascular: Negative for chest pain, palpitations and leg swelling.  Gastrointestinal: Negative for heartburn, abdominal pain and constipation.  Musculoskeletal: Negative for myalgias and joint pain.  Skin: Negative.   Psychiatric/Behavioral: The patient is not nervous/anxious.      Physical Exam  Constitutional: She is oriented to person, place, and time. No distress.  Obese   Neck: Neck supple. No JVD present.  Cardiovascular: Normal rate, regular rhythm and intact distal pulses.   Respiratory: Effort normal and breath sounds normal. No respiratory distress.  GI: Soft. Bowel sounds are normal. She exhibits no distension. There is no tenderness.  Musculoskeletal: Normal range of motion. She exhibits no edema.  Is status post right knee replacement   Neurological: She is alert and oriented to person, place, and time.  Skin: Skin is warm and dry. She is not diaphoretic.  Incision line without signs of infection present        ASSESSMENT/ PLAN:  Will discharge to home with home health  for pt to evaluate and treat as indicated for gait. Her prescriptions have been written for a 30 day supply of her medications with #30 5/325 mg percocet tabs. She has a follow up with Dr. Jodi Geralds on 01-03-15 and Dr. Tally Joe on 01-06-15. She will need a 3:1 commode.    Time spent with patient 40 minutes.    Synthia Innocent NP [facility]  Contact [phone] Monday through Friday 8am- 5pm  After hours call [phone]

## 2015-03-10 DIAGNOSIS — R112 Nausea with vomiting, unspecified: Secondary | ICD-10-CM | POA: Insufficient documentation

## 2015-03-10 DIAGNOSIS — E785 Hyperlipidemia, unspecified: Secondary | ICD-10-CM | POA: Insufficient documentation

## 2015-03-10 DIAGNOSIS — Z9889 Other specified postprocedural states: Secondary | ICD-10-CM

## 2015-03-10 DIAGNOSIS — I10 Essential (primary) hypertension: Secondary | ICD-10-CM | POA: Insufficient documentation

## 2015-03-10 DIAGNOSIS — Z96659 Presence of unspecified artificial knee joint: Secondary | ICD-10-CM | POA: Insufficient documentation

## 2016-02-03 ENCOUNTER — Encounter (HOSPITAL_COMMUNITY): Payer: Self-pay | Admitting: Emergency Medicine

## 2016-02-03 ENCOUNTER — Ambulatory Visit (HOSPITAL_COMMUNITY): Payer: Medicare Other

## 2016-02-03 ENCOUNTER — Ambulatory Visit (HOSPITAL_COMMUNITY)
Admission: EM | Admit: 2016-02-03 | Discharge: 2016-02-03 | Disposition: A | Discharge status: Home / Self Care | Payer: Medicare Other | Attending: Family Medicine | Admitting: Family Medicine

## 2016-02-03 DIAGNOSIS — Z79899 Other long term (current) drug therapy: Secondary | ICD-10-CM | POA: Diagnosis not present

## 2016-02-03 DIAGNOSIS — R52 Pain, unspecified: Secondary | ICD-10-CM

## 2016-02-03 DIAGNOSIS — S46911A Strain of unspecified muscle, fascia and tendon at shoulder and upper arm level, right arm, initial encounter: Secondary | ICD-10-CM | POA: Diagnosis not present

## 2016-02-03 DIAGNOSIS — S4990XA Unspecified injury of shoulder and upper arm, unspecified arm, initial encounter: Secondary | ICD-10-CM | POA: Diagnosis present

## 2016-02-03 DIAGNOSIS — Z7982 Long term (current) use of aspirin: Secondary | ICD-10-CM | POA: Insufficient documentation

## 2016-02-03 DIAGNOSIS — S20219A Contusion of unspecified front wall of thorax, initial encounter: Secondary | ICD-10-CM

## 2016-02-03 DIAGNOSIS — S20213A Contusion of bilateral front wall of thorax, initial encounter: Secondary | ICD-10-CM

## 2016-02-03 DIAGNOSIS — E785 Hyperlipidemia, unspecified: Secondary | ICD-10-CM | POA: Insufficient documentation

## 2016-02-03 DIAGNOSIS — W109XXA Fall (on) (from) unspecified stairs and steps, initial encounter: Secondary | ICD-10-CM | POA: Insufficient documentation

## 2016-02-03 DIAGNOSIS — S20211A Contusion of right front wall of thorax, initial encounter: Secondary | ICD-10-CM | POA: Insufficient documentation

## 2016-02-03 DIAGNOSIS — Z888 Allergy status to other drugs, medicaments and biological substances status: Secondary | ICD-10-CM | POA: Diagnosis not present

## 2016-02-03 DIAGNOSIS — I10 Essential (primary) hypertension: Secondary | ICD-10-CM | POA: Insufficient documentation

## 2016-02-03 DIAGNOSIS — S20212A Contusion of left front wall of thorax, initial encounter: Secondary | ICD-10-CM | POA: Diagnosis not present

## 2016-02-03 NOTE — Discharge Instructions (Signed)
Ice pack, tylenol and sling for comfort, see dr graves on tues at 9:30 for recheck.

## 2016-02-03 NOTE — ED Notes (Signed)
Reports fall yesterday and having ribcage and right shoulder pain.  Patient has tried nothing for pain.

## 2016-02-03 NOTE — ED Notes (Signed)
Patient ready for transport to [facility]ology[facility]

## 2016-02-03 NOTE — ED Provider Notes (Signed)
CSN: [id number]     Arrival date & time 02/03/16  1305 History   First MD Initiated Contact with Patient 02/03/16 1329     Chief Complaint  Patient presents with  . Fall   (Consider location/radiation/quality/duration/timing/severity/associated sxs/prior Treatment) Patient is a 75 y.o. female presenting with shoulder injury. The history is provided by the patient.  Shoulder Injury This is a new problem. The current episode started yesterday (pulled by her chow dog down stairs and fell onto right shoulder and chest, with persistent pain). The problem has been gradually worsening. Associated symptoms include chest pain. Pertinent negatives include no shortness of breath.    Past Medical History  Diagnosis Date  . Hypertension   . Arthritis   . PONV (postoperative nausea and vomiting)   . Hyperlipidemia    Past Surgical History  Procedure Laterality Date  . Appendectomy    . Replacement total knee    . Tonsillectomy    . Abdominal hysterectomy    . Total knee arthroplasty Right 12/24/2014    Procedure: TOTAL KNEE ARTHROPLASTY;  Surgeon: Jodi Geralds, MD;  Location: [facility];  Service: Orthopedics;  Laterality: Right;   No family history on file. Social History  Substance Use Topics  . Smoking status: Never Smoker   . Smokeless tobacco: Never Used  . Alcohol Use: No   OB History    No data available     Review of Systems  Constitutional: Negative.   Respiratory: Negative for shortness of breath.   Cardiovascular: Positive for chest pain.  Gastrointestinal: Negative.   Musculoskeletal: Positive for joint swelling. Negative for back pain, gait problem and neck pain.  Skin: Negative.   Neurological: Negative.   All other systems reviewed and are negative.   Allergies  Compazine and Phenothiazines  Home Medications   Prior to Admission medications   Medication Sig Start Date End Date Taking? Authorizing Provider  hydrochlorothiazide (HYDRODIURIL) 12.5 MG tablet Take 12.5  mg by mouth daily. 11/01/14  Yes Historical Provider, MD  Pregabalin (LYRICA PO) Take by mouth.   Yes Historical Provider, MD  simvastatin (ZOCOR) 20 MG tablet Take 20 mg by mouth every evening. 09/30/14  Yes Historical Provider, MD  aspirin EC 325 MG tablet Take 1 tablet (325 mg total) by mouth 2 (two) times daily after a meal. Take x 1 month post op to decrease risk of blood clots. 12/24/14   Marshia Ly, PA-C  Calcium Carb-Cholecalciferol (CALCIUM + D3) 600-200 MG-UNIT TABS Take 1 tablet by mouth daily.    Historical Provider, MD  methocarbamol (ROBAXIN-750) 750 MG tablet Take 1 tablet (750 mg total) by mouth every 8 (eight) hours as needed for muscle spasms. 12/24/14   Marshia Ly, PA-C  ondansetron (ZOFRAN ODT) 4 MG disintegrating tablet Take 1 tablet (4 mg total) by mouth every 8 (eight) hours as needed for nausea. 11/14/14   Garlon Hatchet, PA-C  oxyCODONE-acetaminophen (PERCOCET/ROXICET) 5-325 MG per tablet Take 1-2 tablets by mouth every 4 (four) hours as needed. 12/24/14   Marshia Ly, PA-C   Meds Ordered and Administered this Visit  Medications - No data to display  BP 134/65 mmHg  Pulse 108  Temp(Src) 99.4 F (37.4 C) (Oral)  Resp 16  SpO2 95% No data found.   Physical Exam  Constitutional: She is oriented to person, place, and time. She appears well-developed and well-nourished. She appears distressed.  HENT:  Head: Normocephalic and atraumatic.  Eyes: Pupils are equal, round, and reactive to light.  Neck:  Normal range of motion. Neck supple.  Cardiovascular: Normal rate, normal heart sounds and intact distal pulses.   Pulmonary/Chest: Effort normal and breath sounds normal. She exhibits tenderness.  Abdominal: Soft. Bowel sounds are normal.  Musculoskeletal:       Right shoulder: She exhibits decreased range of motion, tenderness, bony tenderness and decreased strength. She exhibits no swelling and normal pulse.       Lumbar back: Normal.       Arms: Lymphadenopathy:     She has no cervical adenopathy.  Neurological: She is alert and oriented to person, place, and time.  Skin: Skin is warm and dry.  Nursing note and vitals reviewed.   ED Course  Procedures (including critical care time)  Labs Review Labs Reviewed - No data to display  Imaging Review Dg Ribs Bilateral W/chest  02/03/2016  CLINICAL DATA:  Pain in lower sternum and anterior chest status post fall. EXAM: BILATERAL RIBS AND CHEST - 4+ VIEW COMPARISON:  Chest radiograph 12/13/2014. FINDINGS: No fracture or other bone lesions are seen involving the ribs. There is no evidence of pneumothorax or pleural effusion. Bibasilar atelectasis is seen. Both lungs are otherwise clear. Heart size and mediastinal contours are within normal limits. IMPRESSION: No evidence of displaced rib fractures. Bibasilar lung atelectasis. Electronically Signed   By: Ted Mcalpineobrinka  Dimitrova M.D.   On: 02/03/2016 14:43   Dg Shoulder Right  02/03/2016  CLINICAL DATA:  Status post fall with right shoulder pain. EXAM: RIGHT SHOULDER - 2+ VIEW COMPARISON:  None. FINDINGS: There is no evidence of fracture or dislocation. There is no evidence of arthropathy or other focal bone abnormality. Soft tissues are unremarkable. IMPRESSION: Negative. Electronically Signed   By: Ted Mcalpineobrinka  Dimitrova M.D.   On: 02/03/2016 14:44     Visual Acuity Review  Right Eye Distance:   Left Eye Distance:   Bilateral Distance:    Right Eye Near:   Left Eye Near:    Bilateral Near:         MDM   1. Right shoulder strain, initial encounter   2. Pain   3. Bilateral contusion of ribs        Linna HoffJames D Keyen Marban, MD 02/03/16 [phone]

## 2016-02-10 ENCOUNTER — Encounter (HOSPITAL_COMMUNITY): Payer: Self-pay | Admitting: Nurse Practitioner

## 2016-02-10 ENCOUNTER — Ambulatory Visit (HOSPITAL_COMMUNITY)
Admission: EM | Admit: 2016-02-10 | Discharge: 2016-02-10 | Disposition: A | Discharge status: Home / Self Care | Payer: Medicare Other | Attending: Family Medicine | Admitting: Family Medicine

## 2016-02-10 DIAGNOSIS — B0222 Postherpetic trigeminal neuralgia: Secondary | ICD-10-CM

## 2016-02-10 MED ORDER — FLUTICASONE PROPIONATE 0.05 % EX CREA: TOPICAL_CREAM | Freq: Two times a day (BID) | CUTANEOUS | Status: DC

## 2016-02-10 NOTE — ED Provider Notes (Signed)
CSN: [id number]     Arrival date & time 02/10/16  1258 History   First MD Initiated Contact with Patient 02/10/16 1315     Chief Complaint  Patient presents with  . Rash   (Consider location/radiation/quality/duration/timing/severity/associated sxs/prior Treatment) Patient is a 75 y.o. female presenting with rash. The history is provided by the patient.  Rash Location:  Face Facial rash location:  R cheek Quality: burning and itchiness   Severity:  Mild Onset quality:  Sudden Duration:  1 month Timing:  Constant Progression:  Partially resolved Chronicity:  New Context comment:  Seen by 3 different md for care and "won't resolve" but is improving sl., persistent itching and burning of rash.   Past Medical History  Diagnosis Date  . Hypertension   . Arthritis   . PONV (postoperative nausea and vomiting)   . Hyperlipidemia    Past Surgical History  Procedure Laterality Date  . Appendectomy    . Replacement total knee    . Tonsillectomy    . Abdominal hysterectomy    . Total knee arthroplasty Right 12/24/2014    Procedure: TOTAL KNEE ARTHROPLASTY;  Surgeon: Jodi Geralds, MD;  Location: [facility];  Service: Orthopedics;  Laterality: Right;   Family History  Problem Relation Age of Onset  . Cancer Mother   . Heart failure Father   . Atrial fibrillation Sister    Social History  Substance Use Topics  . Smoking status: Never Smoker   . Smokeless tobacco: Never Used  . Alcohol Use: No   OB History    No data available     Review of Systems  Constitutional: Negative.   Skin: Positive for rash.  All other systems reviewed and are negative.   Allergies  Compazine and Phenothiazines  Home Medications   Prior to Admission medications   Medication Sig Start Date End Date Taking? Authorizing Provider  aspirin EC 325 MG tablet Take 1 tablet (325 mg total) by mouth 2 (two) times daily after a meal. Take x 1 month post op to decrease risk of blood clots. 12/24/14   Marshia Ly, PA-C  Calcium Carb-Cholecalciferol (CALCIUM + D3) 600-200 MG-UNIT TABS Take 1 tablet by mouth daily.    Historical Provider, MD  fluticasone (CUTIVATE) 0.05 % cream Apply topically 2 (two) times daily. 02/10/16   Linna Hoff, MD  hydrochlorothiazide (HYDRODIURIL) 12.5 MG tablet Take 12.5 mg by mouth daily. 11/01/14   Historical Provider, MD  methocarbamol (ROBAXIN-750) 750 MG tablet Take 1 tablet (750 mg total) by mouth every 8 (eight) hours as needed for muscle spasms. 12/24/14   Marshia Ly, PA-C  ondansetron (ZOFRAN ODT) 4 MG disintegrating tablet Take 1 tablet (4 mg total) by mouth every 8 (eight) hours as needed for nausea. 11/14/14   Garlon Hatchet, PA-C  oxyCODONE-acetaminophen (PERCOCET/ROXICET) 5-325 MG per tablet Take 1-2 tablets by mouth every 4 (four) hours as needed. 12/24/14   Marshia Ly, PA-C  Pregabalin (LYRICA PO) Take by mouth.    Historical Provider, MD  simvastatin (ZOCOR) 20 MG tablet Take 20 mg by mouth every evening. 09/30/14   Historical Provider, MD   Meds Ordered and Administered this Visit  Medications - No data to display  BP 150/83 mmHg  Pulse 89  Temp(Src) 98.4 F (36.9 C) (Oral)  Resp 18  SpO2 96% No data found.   Physical Exam  Constitutional: She is oriented to person, place, and time. She appears well-developed and well-nourished. She appears distressed.  HENT:  Right Ear: External ear normal.  Left Ear: External ear normal.  Mouth/Throat: Oropharynx is clear and moist.  Neck: Normal range of motion. Neck supple.  Lymphadenopathy:    She has no cervical adenopathy.  Neurological: She is alert and oriented to person, place, and time.  Skin: Skin is warm and dry. Rash noted.  Erythema and sx to maxillary branch of right facial n. Min lesions reoslving according to pt., no infection., no mandibular sx.  Nursing note and vitals reviewed.   ED Course  Procedures (including critical care time)  Labs Review Labs Reviewed - No data to  display  Imaging Review No results found.   Visual Acuity Review  Right Eye Distance:   Left Eye Distance:   Bilateral Distance:    Right Eye Near:   Left Eye Near:    Bilateral Near:         MDM   1. Neuropathic postherpetic trigeminal neuralgia        Linna HoffJames D Lincon Sahlin, MD 02/10/16 1354

## 2016-02-10 NOTE — ED Notes (Signed)
Pt c/o 2 month history of itchy rash on R side of her face. Spreading now to eye and forehead. She has been to several doctors and opthamology for this rash and treated with valcylovir, prednisone, lyrica but rash persists.

## 2016-09-20 LAB — LIPID PANEL: LDL Cholesterol: 102 mg/dL

## 2016-09-20 LAB — HEMOGLOBIN A1C: Hemoglobin A1C: 5.9

## 2016-11-01 LAB — TSH: TSH: 6.3 u[IU]/mL — AB (ref 0.41–5.90)

## 2016-12-27 ENCOUNTER — Ambulatory Visit (INDEPENDENT_AMBULATORY_CARE_PROVIDER_SITE_OTHER): Payer: Medicare Other | Admitting: Endocrinology

## 2016-12-27 ENCOUNTER — Telehealth: Payer: Self-pay

## 2016-12-27 ENCOUNTER — Encounter: Payer: Self-pay | Admitting: Endocrinology

## 2016-12-27 VITALS — BP 148/100 | HR 96 | Ht 68.0 in | Wt 232.6 lb

## 2016-12-27 DIAGNOSIS — E063 Autoimmune thyroiditis: Secondary | ICD-10-CM

## 2016-12-27 LAB — TSH: TSH: 3.13 u[IU]/mL (ref 0.35–4.50)

## 2016-12-27 LAB — T4, FREE: Free T4: 0.89 ng/dL (ref 0.60–1.60)

## 2016-12-27 NOTE — Progress Notes (Signed)
Please call to let patient know that the thyroid result is now normal and no treatment needed, she needs to follow-up with PCP now

## 2016-12-27 NOTE — Progress Notes (Signed)
Patient ID: Diamond White, female   DOB: 1941-07-01, 76 y.o.   MRN: [id number]            Referring Physician: Azucena Cecil  Reason for Appointment:  Hypothyroidism, new visit    History of Present Illness:   Hypothyroidism was first diagnosed in 08/2016  At the time of diagnosis patient was having symptoms of  fatigue She says she has been feeling more tired for about 2-3 months, this is mostly in the late afternoon that she feels tired and may occasionally take a nap Does not have any new cold sensitivity, just feels a little cold in her extremities No difficulty concentrating, no new dry skin, no hair changes She had gained weight recently but this was from decreased physical activity  Since her TSH was only mildly increased in January at 4.8 she had a repeat level done in 10/2016 which was 6.3 Free T4 not available  The patient has been treated with  50 g of levothyroxine which was prescribed in March  With starting thyroid supplementation the patient's symptoms did not improve but she only took this about 2 weeks She was trying to take this 45 minutes before breakfast and this would cause abdominal cramping and she stopped taking it  She is now referred here for further management         Patient's weight history is as follows:  Wt Readings from Last 3 Encounters:  12/27/16 232 lb 9.6 oz (105.5 kg)  12/31/14 216 lb (98 kg)  12/25/14 216 lb (98 kg)    Thyroid function results have been as Above:  Lab Results  Component Value Date   TSH 6.30 (A) 11/01/2016     Past Medical History:  Diagnosis Date  . Arthritis   . Hyperlipidemia   . Hypertension   . PONV (postoperative nausea and vomiting)     Past Surgical History:  Procedure Laterality Date  . ABDOMINAL HYSTERECTOMY    . APPENDECTOMY    . REPLACEMENT TOTAL KNEE    . TONSILLECTOMY    . TOTAL KNEE ARTHROPLASTY Right 12/24/2014   Procedure: TOTAL KNEE ARTHROPLASTY;  Surgeon: Jodi Geralds, MD;  Location: [facility]  OR;  Service: Orthopedics;  Laterality: Right;    Family History  Problem Relation Age of Onset  . Cancer Mother   . Heart failure Father   . Atrial fibrillation Sister   . Thyroid disease Neg Hx     Social History:  reports that she has never smoked. She has never used smokeless tobacco. She reports that she does not drink alcohol or use drugs.  Allergies:  Allergies  Allergen Reactions  . Compazine [Prochlorperazine] Other (See Comments)    Oral swelling   . Phenothiazines Swelling and Other (See Comments)    Oral swelling    Allergies as of 12/27/2016      Reactions   Compazine [prochlorperazine] Other (See Comments)   Oral swelling   Phenothiazines Swelling, Other (See Comments)   Oral swelling      Medication List       Accurate as of 12/27/16 12:51 PM. Always use your most recent med list.          fluticasone 0.05 % cream Commonly known as:  CUTIVATE Apply topically 2 (two) times daily.   fluticasone 50 MCG/ACT nasal spray Commonly known as:  FLONASE Place 1 spray into both nostrils daily.   hydrochlorothiazide 12.5 MG tablet Commonly known as:  HYDRODIURIL Take 12.5 mg by mouth daily.  simvastatin 20 MG tablet Commonly known as:  ZOCOR Take 20 mg by mouth every evening.          Review of Systems  Constitutional: Positive for weight gain.  Respiratory: Negative for shortness of breath.   Cardiovascular: Negative for leg swelling.  Gastrointestinal: Negative for constipation.  Endocrine: Positive for fatigue and cold intolerance.       Arms and legs only feel cold, not new  Musculoskeletal: Positive for joint pain.       Right shoulder pain  Skin: Positive for dry skin.  Neurological: Negative for weakness.  Psychiatric/Behavioral: Negative for insomnia.   Hypertension: She is only on HCTZ, this is long-standing She thinks her blood pressure was fairly good with her PCP earlier this year and today she feels anxious              Examination:    BP (!) 148/100   Pulse 96   Ht 5\' 8"  (1.727 m)   Wt 232 lb 9.6 oz (105.5 kg)   SpO2 94%   BMI 35.37 kg/m   GENERAL:  Average build.  Has generalized obesity  No pallor, clubbing, lymphadenopathy or edema.    Skin:  no rash or pigmentation.  EYES:  No prominence of the eyes or swelling of the eyelids  ENT: Exam not indicated  THYROID:  Not palpable.  Trachea slightly deviated to the right  HEART:  Normal  S1 and S2; no murmur or click.  CHEST:    Lungs: Vescicular breath sounds heard equally.  No crepitations/ wheeze.  ABDOMEN:  No distention.  Liver and spleen not palpable.  No other mass or tenderness.  NEUROLOGICAL: Reflexes are bilaterally normal at ankles  JOINTS:  Normal peripheral joints.   Assessment:  HYPOTHYROIDISM, Mild with TSH 6.3, this was done in March  She does have new onset of fatigue although did not feel any better with taking 50 g of levothyroxine for about 2 weeks However she stopped taking the levothyroxine because of abdominal discomfort associated with taking the medication before breakfast  No goiter on exam Have discussed causative factors for hypothyroidism including autoimmune thyroid disease and patient handout on hyperthyroidism given  PREDIABETES: Her A1c has been as high as 6% and fasting glucose recently 119 She has gained weight and is planning to start walking for exercise which may help May also benefit from consultation with dietitian for meal planning  PLAN:   Recheck thyroid levels today If her TSH is consistently high may consider trying 25 g of levothyroxine with food such as at lunchtime for better tolerability However if her TSH levels are normal May consider observation only  Discussed with the patient that if she has normal thyroid levels on levothyroxine supplementation without improvement in her fatigue and may not need long-term treatment as yet Otherwise if she needs to be on supplements this  would be most likely lifelong  Follow-up in 6 weeks   Diamond White 12/27/2016, 12:51 PM   Consultation note copy sent to the PCP  Note: This office note was prepared with Dragon voice recognition system technology. Any transcriptional errors that result from this process are unintentional.

## 2016-12-27 NOTE — Telephone Encounter (Signed)
Called [facility] at [facility] and medical records is sending 13 pages of labs for this patient.

## 2016-12-28 ENCOUNTER — Telehealth: Payer: Self-pay | Admitting: *Deleted

## 2016-12-28 NOTE — Telephone Encounter (Signed)
Patient states she is returning a call back.  °

## 2017-02-04 ENCOUNTER — Other Ambulatory Visit: Payer: 59

## 2017-02-07 ENCOUNTER — Ambulatory Visit: Payer: 59 | Admitting: Endocrinology

## 2017-07-26 IMAGING — DX DG RIBS W/ CHEST 3+V BILAT
6 series · 6 of 6 positions shown · non-contrast
Comparison: Chest radiograph 12/13/2014.

CLINICAL DATA: Pain in lower sternum and anterior chest status post
fall.

EXAM:
BILATERAL RIBS AND CHEST - 4+ VIEW

[w chest pa]
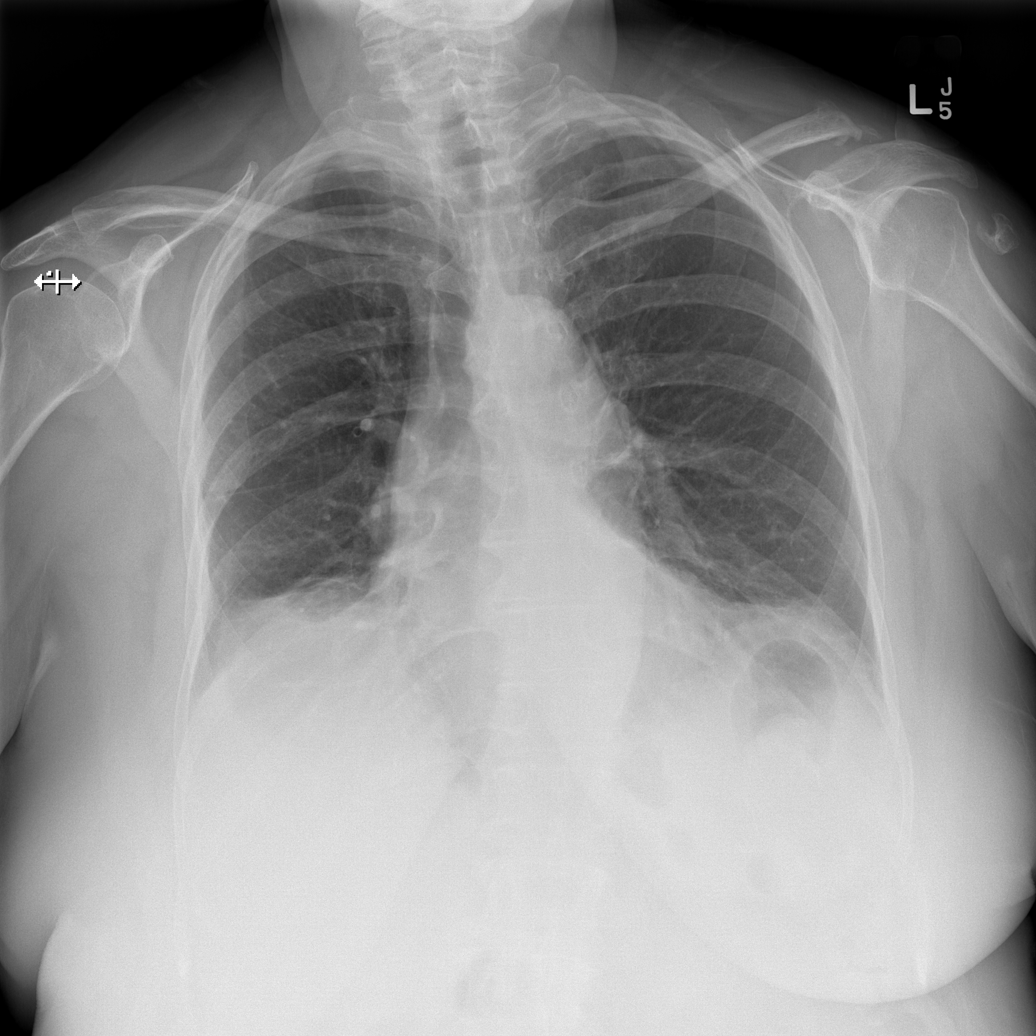

[w ribs pa upper left (1 of 2)]
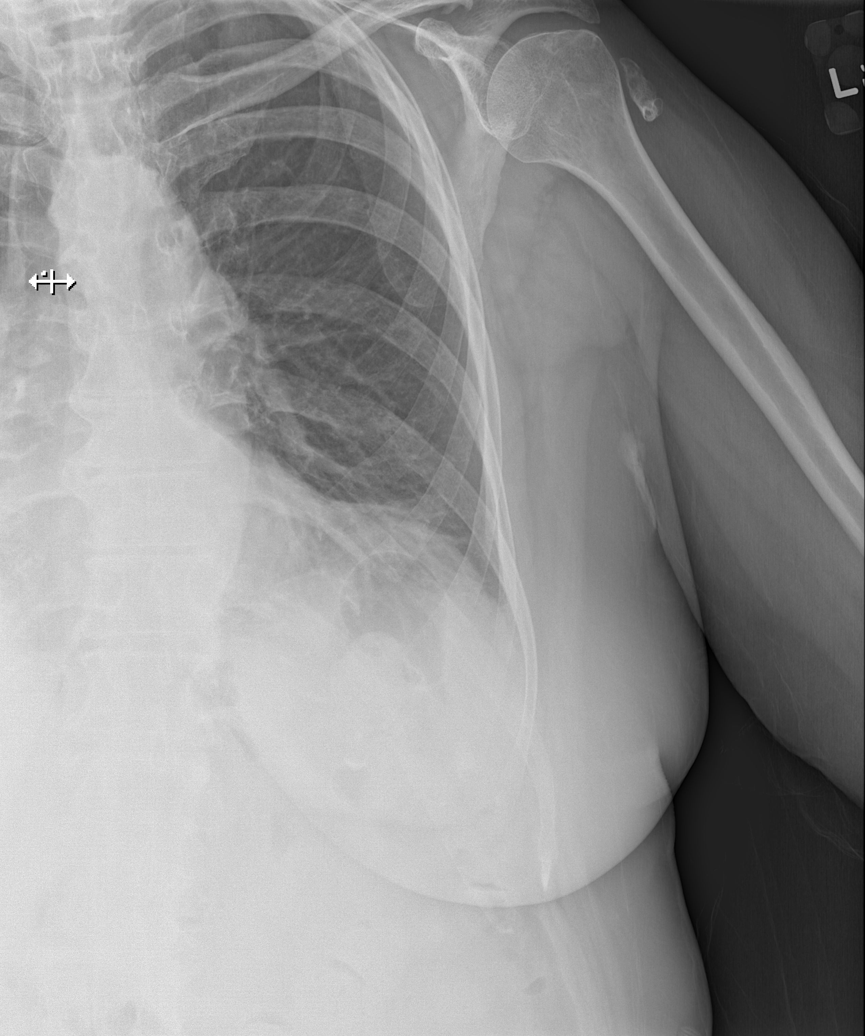

[w ribs pa upper left (2 of 2)]
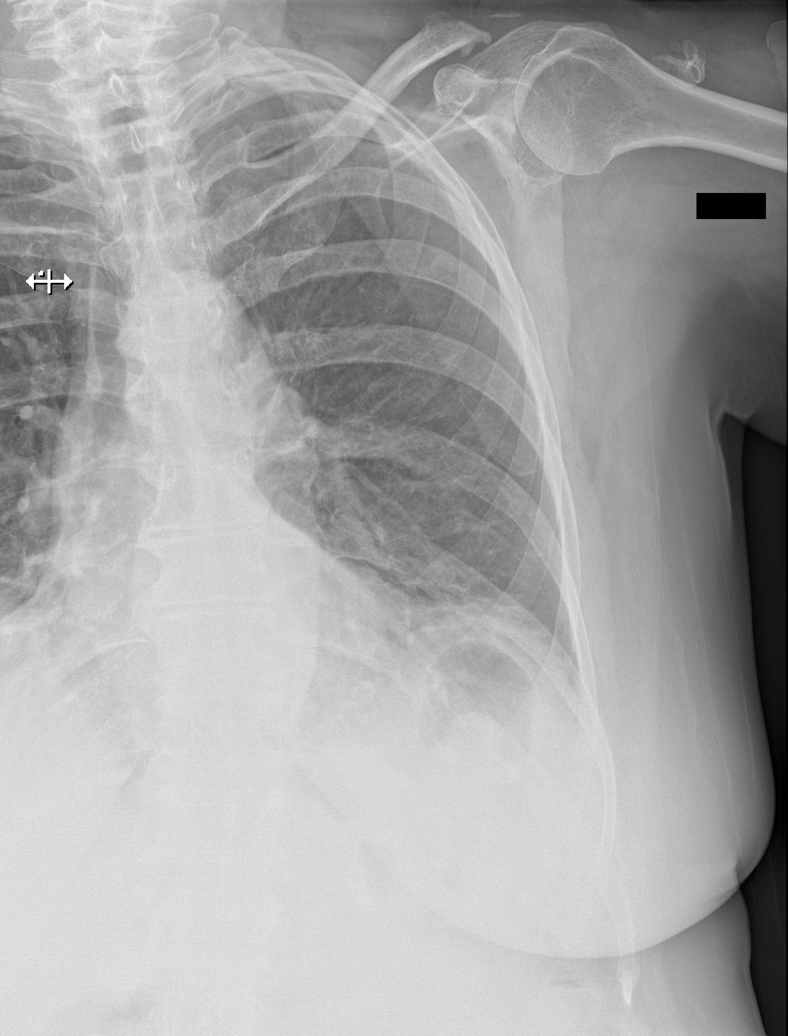

[w ribs obl left]
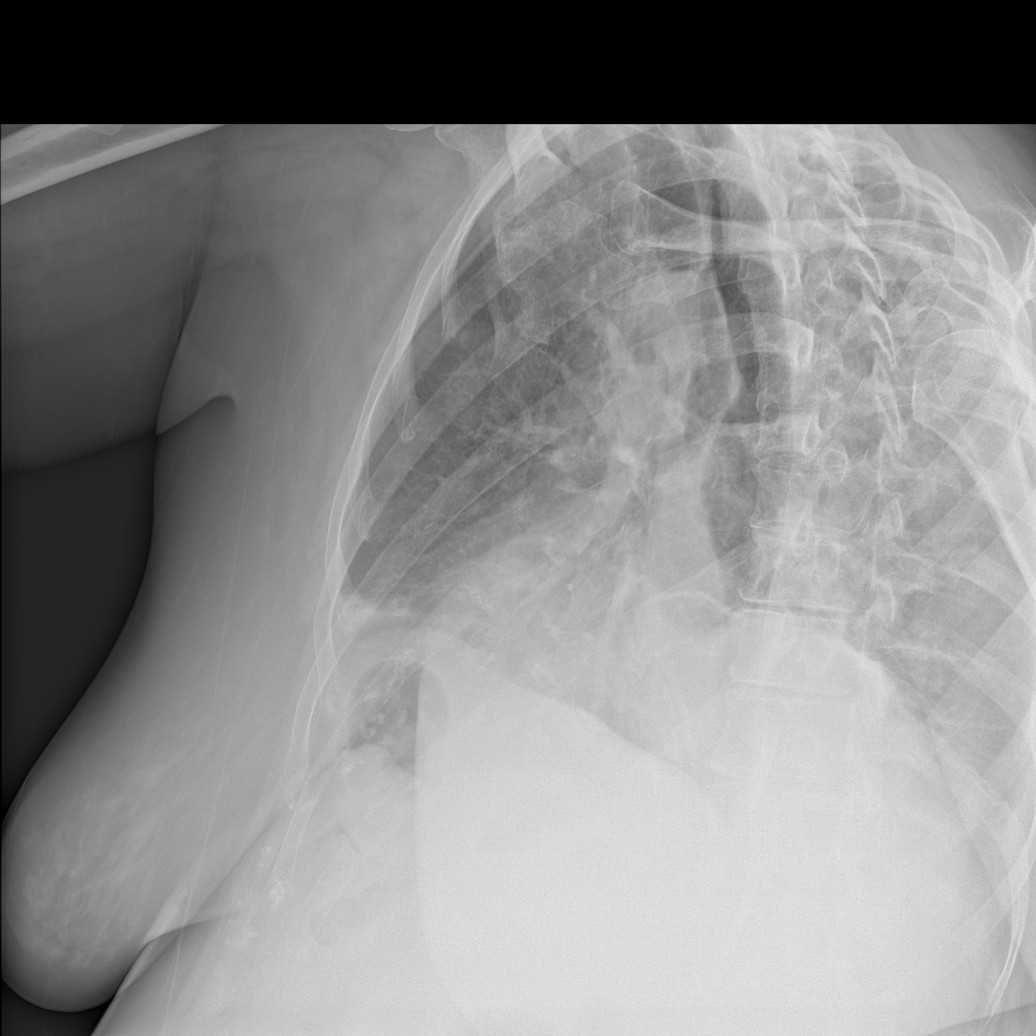

[w ribs pa upper right]
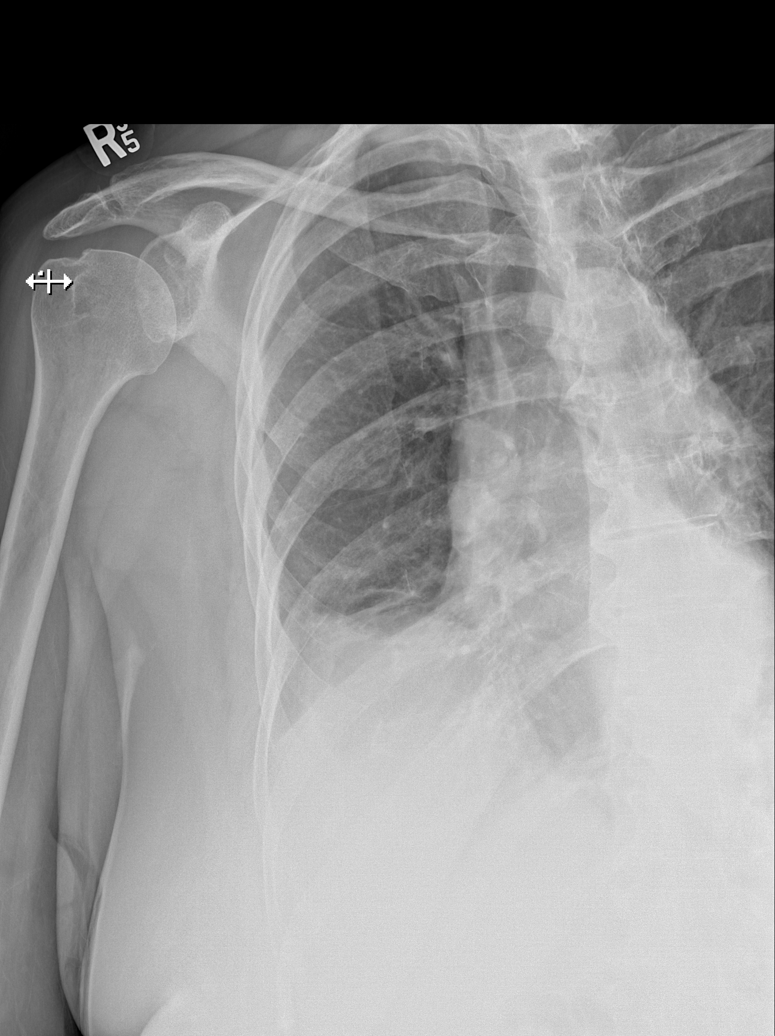

[w ribs pa lower right]
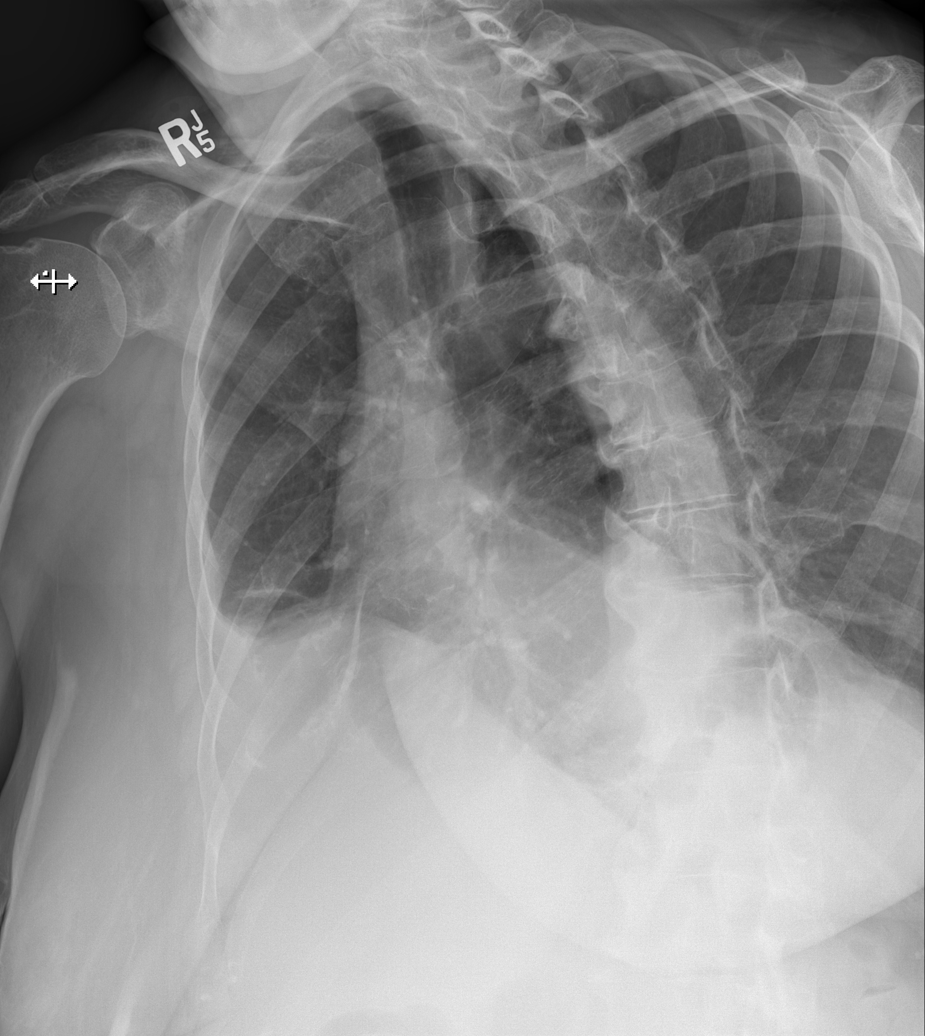

[6 of 6 positions shown; findings below may reference images not displayed]

FINDINGS: No fracture or other bone lesions are seen involving the ribs. There
is no evidence of pneumothorax or pleural effusion. Bibasilar
atelectasis is seen. Both lungs are otherwise clear. Heart size and
mediastinal contours are within normal limits.
IMPRESSION: No evidence of displaced rib fractures.

Bibasilar lung atelectasis.

## 2019-02-21 ENCOUNTER — Encounter (HOSPITAL_COMMUNITY): Payer: Self-pay | Admitting: Physician Assistant

## 2019-02-21 ENCOUNTER — Other Ambulatory Visit: Payer: Self-pay

## 2019-02-21 ENCOUNTER — Ambulatory Visit (HOSPITAL_COMMUNITY)
Admission: EM | Admit: 2019-02-21 | Discharge: 2019-02-21 | Disposition: A | Discharge status: Home / Self Care | Payer: Medicare Other | Attending: Physician Assistant | Admitting: Physician Assistant

## 2019-02-21 DIAGNOSIS — S20161A Insect bite (nonvenomous) of breast, right breast, initial encounter: Secondary | ICD-10-CM

## 2019-02-21 DIAGNOSIS — S40861A Insect bite (nonvenomous) of right upper arm, initial encounter: Secondary | ICD-10-CM | POA: Diagnosis not present

## 2019-02-21 DIAGNOSIS — I10 Essential (primary) hypertension: Secondary | ICD-10-CM

## 2019-02-21 DIAGNOSIS — W57XXXA Bitten or stung by nonvenomous insect and other nonvenomous arthropods, initial encounter: Secondary | ICD-10-CM | POA: Diagnosis not present

## 2019-02-21 DIAGNOSIS — S40862A Insect bite (nonvenomous) of left upper arm, initial encounter: Secondary | ICD-10-CM | POA: Diagnosis not present

## 2019-02-21 MED ORDER — PREDNISONE 50 MG PO TABS: 50.0000 mg | ORAL_TABLET | Freq: Every day | ORAL | 0 refills | Status: DC

## 2019-02-21 NOTE — Discharge Instructions (Signed)
Start zyrtec 5mg  daily to help with itching. Continue cortisone cream to help with itching. Avoid scratching, you can try ice to help with itching. If worsening itching, can fill prednisone to help with symptoms. Monitor for pain, spreading redness, warmth, fever, follow up for reevaluation needed.

## 2019-02-21 NOTE — ED Provider Notes (Signed)
[facility]    CSN: [id number] Arrival date & time: 02/21/19  1500     History   Chief Complaint Chief Complaint  Patient presents with  . Rash  . Insect Bite    HPI Diamond White is a 78 y.o. female.   78 year old female comes in for few day history of insect bites.  States most significant to the right breast with erythema, itching.  Denies pain.  Also has multiple insect bites to the upper extremity, neck.  Denies spreading erythema, warmth.  Denies fever, chills, night sweats.  Has been applying cortisone with mild relief.     Past Medical History:  Diagnosis Date  . Arthritis   . Hyperlipidemia   . Hypertension   . PONV (postoperative nausea and vomiting)     Patient Active Problem List   Diagnosis Date Noted  . S/P total knee replacement 03/10/2015  . Benign essential HTN 03/10/2015  . Hyperlipidemia 03/10/2015  . PONV (postoperative nausea and vomiting) 03/10/2015  . Primary osteoarthritis of right knee 12/24/2014    Past Surgical History:  Procedure Laterality Date  . ABDOMINAL HYSTERECTOMY    . APPENDECTOMY    . REPLACEMENT TOTAL KNEE    . TONSILLECTOMY    . TOTAL KNEE ARTHROPLASTY Right 12/24/2014   Procedure: TOTAL KNEE ARTHROPLASTY;  Surgeon: Jodi GeraldsJohn Graves, MD;  Location: [facility];  Service: Orthopedics;  Laterality: Right;    OB History   No obstetric history on file.      Home Medications    Prior to Admission medications   Medication Sig Start Date End Date Taking? Authorizing Provider  fluticasone (CUTIVATE) 0.05 % cream Apply topically 2 (two) times daily. Patient not taking: Reported on 12/27/2016 02/10/16   Linna HoffKindl, James D, MD  fluticasone [place](FLONASE) 50 MCG/ACT nasal spray Place 1 spray into both nostrils daily.    [provider]  hydrochlorothiazide (HYDRODIURIL) 12.5 MG tablet Take 12.5 mg by mouth daily. 11/01/14   [provider]  predniSONE (DELTASONE) 50 MG tablet Take 1 tablet (50 mg total) by mouth  daily. 02/21/19   Cathie HoopsYu, Amy V, PA-C  simvastatin (ZOCOR) 20 MG tablet Take 20 mg by mouth every evening. 09/30/14   [provider]    Family History Family History  Problem Relation Age of Onset  . Cancer Mother   . Heart failure Father   . Atrial fibrillation Sister   . Thyroid disease Neg Hx     Social History Social History   Tobacco Use  . Smoking status: Never Smoker  . Smokeless tobacco: Never Used  Substance Use Topics  . Alcohol use: No  . Drug use: No    Types: Oxycodone     Allergies   Compazine [prochlorperazine] and Phenothiazines   Review of Systems Review of Systems  Reason unable to perform ROS: See HPI as above.     Physical Exam Triage Vital Signs ED Triage Vitals [02/21/19 1527]  Enc Vitals Group     BP (!) 157/74     Pulse Rate 92     Resp 18     Temp 98.6 F (37 C)     Temp Source Oral     SpO2 97 %     Weight      Height      Head Circumference      Peak Flow      Pain Score 2     Pain Loc      Pain Edu?  Excl. in [place]?    No data found.  Updated Vital Signs BP (!) 157/74 (BP Location: Right Arm)   Pulse 92   Temp 98.6 F (37 C) (Oral)   Resp 18   SpO2 97%   Visual Acuity Right Eye Distance:   Left Eye Distance:   Bilateral Distance:    Right Eye Near:   Left Eye Near:    Bilateral Near:     Physical Exam Constitutional:      General: She is not in acute distress.    Appearance: She is well-developed. She is not ill-appearing, toxic-appearing or diaphoretic.  HENT:     Head: Normocephalic and atraumatic.  Eyes:     Conjunctiva/sclera: Conjunctivae normal.     Pupils: Pupils are equal, round, and reactive to light.  Neck:     Musculoskeletal: Normal range of motion and neck supple.  Pulmonary:     Effort: Pulmonary effort is normal. No respiratory distress.  Skin:    General: Skin is warm and dry.     Comments: 2 cm x 3 cm erythematous area to the right lateral breast.  No fluctuance, induration.  No  tenderness to palpation.  No warmth.  Other multiple lesions to the upper extremity bilaterally with similar presentation, most consistent with insect bite.  No signs of cellulitis seen.  Neurological:     Mental Status: She is alert and oriented to person, place, and time.      UC Treatments / Results  Labs (all labs ordered are listed, but only abnormal results are displayed) Labs Reviewed - No data to display  EKG None  Radiology No results found.  Procedures Procedures (including critical care time)  Medications Ordered in UC Medications - No data to display  Initial Impression / Assessment and Plan / UC Course  I have reviewed the triage vital signs and the nursing notes.  Pertinent labs & imaging results that were available during my care of the patient were reviewed by me and considered in my medical decision making (see chart for details).    Will have patient start Zyrtec, and continue cortisone cream.  Ice compress to avoid itching.  Written Rx of prednisone provided, if worsening itching despite symptomatic treatment, can start to help with symptoms.  Return precautions given.  Patient expresses understanding and agrees to plan.  Final Clinical Impressions(s) / UC Diagnoses   Final diagnoses:  Multiple insect bites   ED Prescriptions    Medication Sig Dispense Auth. Provider   predniSONE (DELTASONE) 50 MG tablet  (Status: Discontinued) Take 1 tablet (50 mg total) by mouth daily. 5 tablet Yu, Amy V, PA-C   predniSONE (DELTASONE) 50 MG tablet  (Status: Discontinued) Take 1 tablet (50 mg total) by mouth daily. 5 tablet Yu, Amy V, PA-C   predniSONE (DELTASONE) 50 MG tablet Take 1 tablet (50 mg total) by mouth daily. 5 tablet Tobin Chad, [place] 02/21/19 1920

## 2019-02-21 NOTE — ED Triage Notes (Signed)
Pt sts itchy rash with possible insect bite

## 2019-07-28 ENCOUNTER — Other Ambulatory Visit: Payer: Self-pay

## 2019-07-28 DIAGNOSIS — Z20822 Contact with and (suspected) exposure to covid-19: Secondary | ICD-10-CM

## 2019-07-29 ENCOUNTER — Telehealth: Payer: Self-pay | Admitting: *Deleted

## 2019-07-29 NOTE — Telephone Encounter (Signed)
Patient called for results ,still pending advised to call back. 

## 2019-07-30 ENCOUNTER — Telehealth: Payer: Self-pay

## 2019-07-30 LAB — NOVEL CORONAVIRUS, NAA: SARS-CoV-2, NAA: NOT DETECTED

## 2019-07-30 NOTE — Telephone Encounter (Signed)
Patient given negative result and verbalized understanding  

## 2019-11-13 ENCOUNTER — Ambulatory Visit (HOSPITAL_COMMUNITY)
Admission: EM | Admit: 2019-11-13 | Discharge: 2019-11-13 | Disposition: A | Discharge status: Home / Self Care | Payer: Medicare Other | Attending: Emergency Medicine | Admitting: Emergency Medicine

## 2019-11-13 ENCOUNTER — Other Ambulatory Visit: Payer: Self-pay

## 2019-11-13 ENCOUNTER — Ambulatory Visit (INDEPENDENT_AMBULATORY_CARE_PROVIDER_SITE_OTHER): Payer: Medicare Other

## 2019-11-13 ENCOUNTER — Encounter (HOSPITAL_COMMUNITY): Payer: Self-pay | Admitting: Emergency Medicine

## 2019-11-13 DIAGNOSIS — E785 Hyperlipidemia, unspecified: Secondary | ICD-10-CM | POA: Diagnosis not present

## 2019-11-13 DIAGNOSIS — M1990 Unspecified osteoarthritis, unspecified site: Secondary | ICD-10-CM | POA: Insufficient documentation

## 2019-11-13 DIAGNOSIS — Z20822 Contact with and (suspected) exposure to covid-19: Secondary | ICD-10-CM | POA: Diagnosis not present

## 2019-11-13 DIAGNOSIS — I10 Essential (primary) hypertension: Secondary | ICD-10-CM | POA: Insufficient documentation

## 2019-11-13 DIAGNOSIS — Z7901 Long term (current) use of anticoagulants: Secondary | ICD-10-CM | POA: Diagnosis not present

## 2019-11-13 DIAGNOSIS — R059 Cough, unspecified: Secondary | ICD-10-CM

## 2019-11-13 DIAGNOSIS — R05 Cough: Secondary | ICD-10-CM | POA: Diagnosis not present

## 2019-11-13 DIAGNOSIS — R0982 Postnasal drip: Secondary | ICD-10-CM | POA: Diagnosis not present

## 2019-11-13 DIAGNOSIS — R0981 Nasal congestion: Secondary | ICD-10-CM | POA: Diagnosis not present

## 2019-11-13 DIAGNOSIS — Z79899 Other long term (current) drug therapy: Secondary | ICD-10-CM | POA: Insufficient documentation

## 2019-11-13 MED ORDER — FLUTICASONE PROPIONATE 50 MCG/ACT NA SUSP: 2.0000 | Freq: Every day | NASAL | 2 refills | Status: DC

## 2019-11-13 MED ORDER — HYDROCOD POLST-CPM POLST ER 10-8 MG/5ML PO SUER: 5.0000 mL | Freq: Two times a day (BID) | ORAL | 0 refills | Status: DC | PRN

## 2019-11-13 MED ORDER — BENZONATATE 200 MG PO CAPS: 200.0000 mg | ORAL_CAPSULE | Freq: Three times a day (TID) | ORAL | 0 refills | Status: DC | PRN

## 2019-11-13 NOTE — ED Triage Notes (Signed)
Patient has a cough for one month.  Patient talked to pcp's nurse and was instructed to come to urgent care and get a covid test and a chest xray.  Patient has a history of bronchitis and at night feels drainage into throat and this makes her cough

## 2019-11-13 NOTE — Discharge Instructions (Addendum)
Your chest x-ray was negative for a pneumonia, fluid in your lungs, cancer, or any other concerning thing today.  You appear to have some scarring in your lungs which I suspect is from the recurrent bronchitis last year.  I do not think that you need antibiotics today.  try the Flonase, saline nasal irrigation with a Lloyd Huger med rinse and distilled water as often as you want to help with the postnasal drip, Tessalon, and if that does not work, then [employer].  Follow-up with your doctor if this does not help to discuss other reasons for your cough.

## 2019-11-13 NOTE — ED Provider Notes (Signed)
HPI  SUBJECTIVE:  Diamond White is a 79 y.o. female who presents with 1 month of a cough present only at night.  She reports nasal congestion in the morning, postnasal drip during the day.  No fevers or weight loss.  No sinus pain or pressure.  No wheezing chest pain shortness of breath.  No burning chest pain belching water brash.  No allergy symptoms.  No lower extremity edema unintentional weight gain orthopnea PND nocturia abdominal pain.  No antibiotics in the past month.  No antipyretic in the past 4 to 6 hours.  States that she had heartburn last week, and started taking omeprazole which she states did not help with the heartburn.  She has tried Hycodan with improvement in her symptoms symptoms are worse with lying down.  She has a past medical history of hypertension on losartan.  She has history of recurrent bronchitis, hiatal hernia GERD prediabetes.  No history of frequent sinusitis asthma emphysema COPD smoking cancer CHF.  TSV:[id number], Onalee Hua, MD   Past Medical History:  Diagnosis Date  . Arthritis   . Hyperlipidemia   . Hypertension   . PONV (postoperative nausea and vomiting)     Past Surgical History:  Procedure Laterality Date  . ABDOMINAL HYSTERECTOMY    . APPENDECTOMY    . REPLACEMENT TOTAL KNEE    . TONSILLECTOMY    . TOTAL KNEE ARTHROPLASTY Right 12/24/2014   Procedure: TOTAL KNEE ARTHROPLASTY;  Surgeon: Jodi Geralds, MD;  Location: [facility];  Service: Orthopedics;  Laterality: Right;    Family History  Problem Relation Age of Onset  . Cancer Mother   . Heart failure Father   . Atrial fibrillation Sister   . Thyroid disease Neg Hx     Social History   Tobacco Use  . Smoking status: Never Smoker  . Smokeless tobacco: Never Used  Substance Use Topics  . Alcohol use: No  . Drug use: No    Types: Oxycodone    No current facility-administered medications for this encounter.  Current Outpatient Medications:  Marland Kitchen  Multiple Vitamins-Minerals (PRESERVISION/LUTEIN  PO), Take by mouth., Disp: , Rfl:  .  benzonatate (TESSALON) 200 MG capsule, Take 1 capsule (200 mg total) by mouth 3 (three) times daily as needed for cough., Disp: 30 capsule, Rfl: 0 .  chlorpheniramine-HYDROcodone (TUSSIONEX PENNKINETIC ER) 10-8 MG/5ML SUER, Take 5 mLs by mouth every 12 (twelve) hours as needed for cough., Disp: 60 mL, Rfl: 0 .  fluticasone (FLONASE) 50 MCG/ACT nasal spray, Place 2 sprays into both nostrils daily., Disp: 16 g, Rfl: 2 .  losartan (COZAAR) 50 MG tablet, Take 50 mg by mouth daily., Disp: , Rfl:  .  simvastatin (ZOCOR) 20 MG tablet, Take 20 mg by mouth every evening., Disp: , Rfl: 3  Allergies  Allergen Reactions  . Compazine [Prochlorperazine] Other (See Comments)    Oral swelling   . Phenothiazines Swelling and Other (See Comments)    Oral swelling     ROS  As noted in HPI.   Physical Exam  BP (!) 152/82 (BP Location: Right Arm)   Pulse 89   Temp 98.5 F (36.9 C) (Oral)   Resp 20   SpO2 97%   O2 sat 97%  Constitutional: Well developed, well nourished, no acute distress Eyes:  EOMI, conjunctiva normal bilaterally HENT: Normocephalic, atraumatic,mucus membranes moist no nasal congestion.  No sinus tenderness.  No obvious postnasal drip. Respiratory: Normal inspiratory effort lungs clear bilaterally, good air movement Cardiovascular: Normal rate  regular rhythm no murmurs rubs or gallops GI: nondistended skin: No rash, skin intact Musculoskeletal: Trace edema bilateral lower extremities calves symmetric, nontender. Neurologic: Alert & oriented x 3, no focal neuro deficits Psychiatric: Speech and behavior appropriate   ED Course   Medications - No data to display  Orders Placed This Encounter  Procedures  . SARS CORONAVIRUS 2 (TAT 6-24 HRS) Nasopharyngeal Nasopharyngeal Swab    Standing Status:   Standing    Number of Occurrences:   1    Order Specific Question:   Is this test for diagnosis or screening    Answer:   Screening     Order Specific Question:   Symptomatic for COVID-19 as defined by CDC    Answer:   No    Order Specific Question:   Hospitalized for COVID-19    Answer:   No    Order Specific Question:   Admitted to ICU for COVID-19    Answer:   No    Order Specific Question:   Previously tested for COVID-19    Answer:   No    Order Specific Question:   Resident in a congregate (group) care setting    Answer:   No    Order Specific Question:   Employed in healthcare setting    Answer:   No    Order Specific Question:   Pregnant    Answer:   No  . DG Chest 2 View    Standing Status:   Standing    Number of Occurrences:   1    Order Specific Question:   Reason for Exam (SYMPTOM  OR DIAGNOSIS REQUIRED)    Answer:   cough x 1 month h/o bronchitis r/o acute changes    No results found for this or any previous visit (from the past 24 hour(s)). DG Chest 2 View  Result Date: 11/13/2019 CLINICAL DATA:  Cough for 1 month. EXAM: CHEST - 2 VIEW COMPARISON:  02/03/2016 FINDINGS: Cardiomediastinal contours are stable. Hilar structures are normal. Linear opacities at the left lung base along the left hemidiaphragm. Lungs are otherwise clear. No evidence of pleural effusion or dense consolidation. No acute musculoskeletal process. IMPRESSION: Atelectasis or scarring at the left lung base. Electronically Signed   By: Donzetta Kohut M.D.   On: 11/13/2019 17:30    ED Clinical Impression  1. Cough      ED Assessment/Plan  [place] Narcotic database reviewed for this patient, and feel that the risk/benefit ratio today is favorable for proceeding with a prescription for controlled substance. last opiate rx 12/1 #100 mL homatropine/hydrocodone  Reviewed imaging independently.  Atelectasis or scarring at the base of the left lung. No effusion, consolidation.  See radiology report for full details.  Covid PCR sent.  No evidence of pneumonia malignancy CHF.  Suspect had a postnasal drip and/or acid reflux because the cough  occurs only at night.  Will send home with Tessalon Tussionex Flonase saline nasal irrigation.  Follow-up with primary care physician if this does not help to discuss next steps.  Discussed imaging, MDM, treatment plan, and plan for follow-up with patient. patient agrees with plan.   Meds ordered this encounter  Medications  . DISCONTD: chlorpheniramine-HYDROcodone (TUSSIONEX PENNKINETIC ER) 10-8 MG/5ML SUER    Sig: Take 5 mLs by mouth every 12 (twelve) hours as needed for cough.    Dispense:  60 mL    Refill:  0  . benzonatate (TESSALON) 200 MG capsule    Sig: Take  1 capsule (200 mg total) by mouth 3 (three) times daily as needed for cough.    Dispense:  30 capsule    Refill:  0  . fluticasone (FLONASE) 50 MCG/ACT nasal spray    Sig: Place 2 sprays into both nostrils daily.    Dispense:  16 g    Refill:  2  . chlorpheniramine-HYDROcodone (TUSSIONEX PENNKINETIC ER) 10-8 MG/5ML SUER    Sig: Take 5 mLs by mouth every 12 (twelve) hours as needed for cough.    Dispense:  60 mL    Refill:  0    *This clinic note was created using [employer]. Therefore, there may be occasional mistakes despite careful proofreading.   ?    Melynda Ripple, MD 11/15/19 [phone]

## 2019-11-14 LAB — SARS CORONAVIRUS 2 (TAT 6-24 HRS): SARS Coronavirus 2: NEGATIVE

## 2019-12-03 ENCOUNTER — Ambulatory Visit: Payer: 59

## 2019-12-17 ENCOUNTER — Ambulatory Visit: Payer: Medicare Other

## 2019-12-29 ENCOUNTER — Ambulatory Visit: Payer: 59

## 2020-04-18 DIAGNOSIS — M2041 Other hammer toe(s) (acquired), right foot: Secondary | ICD-10-CM | POA: Diagnosis not present

## 2020-04-18 DIAGNOSIS — B351 Tinea unguium: Secondary | ICD-10-CM | POA: Diagnosis not present

## 2020-04-18 DIAGNOSIS — L600 Ingrowing nail: Secondary | ICD-10-CM | POA: Diagnosis not present

## 2020-04-18 DIAGNOSIS — M79671 Pain in right foot: Secondary | ICD-10-CM | POA: Diagnosis not present

## 2020-04-18 DIAGNOSIS — M79675 Pain in left toe(s): Secondary | ICD-10-CM | POA: Diagnosis not present

## 2020-04-19 DIAGNOSIS — H524 Presbyopia: Secondary | ICD-10-CM | POA: Diagnosis not present

## 2020-04-19 DIAGNOSIS — H52203 Unspecified astigmatism, bilateral: Secondary | ICD-10-CM | POA: Diagnosis not present

## 2020-05-05 DIAGNOSIS — L600 Ingrowing nail: Secondary | ICD-10-CM | POA: Diagnosis not present

## 2020-05-16 DIAGNOSIS — M79674 Pain in right toe(s): Secondary | ICD-10-CM | POA: Diagnosis not present

## 2020-05-16 DIAGNOSIS — B351 Tinea unguium: Secondary | ICD-10-CM | POA: Diagnosis not present

## 2020-05-16 DIAGNOSIS — M79675 Pain in left toe(s): Secondary | ICD-10-CM | POA: Diagnosis not present

## 2020-05-16 DIAGNOSIS — L600 Ingrowing nail: Secondary | ICD-10-CM | POA: Diagnosis not present

## 2020-05-17 DIAGNOSIS — E782 Mixed hyperlipidemia: Secondary | ICD-10-CM | POA: Diagnosis not present

## 2020-05-17 DIAGNOSIS — E1169 Type 2 diabetes mellitus with other specified complication: Secondary | ICD-10-CM | POA: Diagnosis not present

## 2020-05-17 DIAGNOSIS — K219 Gastro-esophageal reflux disease without esophagitis: Secondary | ICD-10-CM | POA: Diagnosis not present

## 2020-05-17 DIAGNOSIS — D696 Thrombocytopenia, unspecified: Secondary | ICD-10-CM | POA: Diagnosis not present

## 2020-05-17 DIAGNOSIS — B0229 Other postherpetic nervous system involvement: Secondary | ICD-10-CM | POA: Diagnosis not present

## 2020-05-17 DIAGNOSIS — D692 Other nonthrombocytopenic purpura: Secondary | ICD-10-CM | POA: Diagnosis not present

## 2020-05-17 DIAGNOSIS — I10 Essential (primary) hypertension: Secondary | ICD-10-CM | POA: Diagnosis not present

## 2020-05-17 DIAGNOSIS — E039 Hypothyroidism, unspecified: Secondary | ICD-10-CM | POA: Diagnosis not present

## 2020-05-17 DIAGNOSIS — L608 Other nail disorders: Secondary | ICD-10-CM | POA: Diagnosis not present

## 2020-05-17 DIAGNOSIS — Z23 Encounter for immunization: Secondary | ICD-10-CM | POA: Diagnosis not present

## 2020-05-17 DIAGNOSIS — F419 Anxiety disorder, unspecified: Secondary | ICD-10-CM | POA: Diagnosis not present

## 2020-05-30 DIAGNOSIS — T8189XA Other complications of procedures, not elsewhere classified, initial encounter: Secondary | ICD-10-CM | POA: Diagnosis not present

## 2020-06-13 DIAGNOSIS — M79675 Pain in left toe(s): Secondary | ICD-10-CM | POA: Diagnosis not present

## 2020-06-13 DIAGNOSIS — M79674 Pain in right toe(s): Secondary | ICD-10-CM | POA: Diagnosis not present

## 2020-06-13 DIAGNOSIS — L600 Ingrowing nail: Secondary | ICD-10-CM | POA: Diagnosis not present

## 2020-06-13 DIAGNOSIS — B351 Tinea unguium: Secondary | ICD-10-CM | POA: Diagnosis not present

## 2020-06-19 ENCOUNTER — Ambulatory Visit (HOSPITAL_COMMUNITY)
Admission: EM | Admit: 2020-06-19 | Discharge: 2020-06-19 | Disposition: A | Discharge status: Home / Self Care | Payer: Medicare HMO | Attending: Family Medicine | Admitting: Family Medicine

## 2020-06-19 ENCOUNTER — Other Ambulatory Visit: Payer: Self-pay

## 2020-06-19 ENCOUNTER — Encounter (HOSPITAL_COMMUNITY): Payer: Self-pay | Admitting: Emergency Medicine

## 2020-06-19 DIAGNOSIS — L03115 Cellulitis of right lower limb: Secondary | ICD-10-CM

## 2020-06-19 MED ORDER — CEFADROXIL 500 MG PO CAPS: 500.0000 mg | ORAL_CAPSULE | Freq: Two times a day (BID) | ORAL | 0 refills | Status: AC

## 2020-06-19 MED ORDER — HYDROCODONE-ACETAMINOPHEN 5-325 MG PO TABS: 1.0000 | ORAL_TABLET | Freq: Four times a day (QID) | ORAL | 0 refills | Status: AC | PRN

## 2020-06-19 NOTE — Discharge Instructions (Addendum)
Take antibiotic 2 times a day Consider taking a probiotic while you are on the antibiotics to protect your stomach Take pain medication as needed.  Do not drive on the pain medicine Elevate your foot as much as you are able See your doctor if not improving in 2 to 3 days

## 2020-06-19 NOTE — ED Provider Notes (Signed)
[facility]    CSN: [id number] Arrival date & time: 06/19/20  1005      History   Chief Complaint Chief Complaint  Patient presents with  . Foot Pain    HPI Diamond White is a 79 y.o. female.   HPI  Pleasant 79 year old woman has had multiple surgeries on her right foot.  Most recently she had her great toenail removed by podiatry.  This was approximately a month ago.  For the last couple of days she has had increased redness, swelling, and pain in her foot.  She thinks she has an infection.  No fever chills.  No body aches.  The pain kept her awake last night.  It is limiting her ability to walk  Past Medical History:  Diagnosis Date  . Arthritis   . Hyperlipidemia   . Hypertension   . PONV (postoperative nausea and vomiting)     Patient Active Problem List   Diagnosis Date Noted  . S/P total knee replacement 03/10/2015  . Benign essential HTN 03/10/2015  . Hyperlipidemia 03/10/2015  . PONV (postoperative nausea and vomiting) 03/10/2015  . Primary osteoarthritis of right knee 12/24/2014    Past Surgical History:  Procedure Laterality Date  . ABDOMINAL HYSTERECTOMY    . APPENDECTOMY    . REPLACEMENT TOTAL KNEE    . TONSILLECTOMY    . TOTAL KNEE ARTHROPLASTY Right 12/24/2014   Procedure: TOTAL KNEE ARTHROPLASTY;  Surgeon: Jodi Geralds, MD;  Location: [facility];  Service: Orthopedics;  Laterality: Right;    OB History   No obstetric history on file.      Home Medications    Prior to Admission medications   Medication Sig Start Date End Date Taking? Authorizing Provider  losartan (COZAAR) 50 MG tablet Take 50 mg by mouth daily. 09/24/19  Yes [provider]  Multiple Vitamins-Minerals (PRESERVISION/LUTEIN PO) Take by mouth.   Yes [provider]  simvastatin (ZOCOR) 20 MG tablet Take 20 mg by mouth every evening. 09/30/14  Yes [provider]  terbinafine (LAMISIL) 250 MG tablet Take 250 mg by mouth daily. 06/13/20  Yes  [provider]  cefadroxil (DURICEF) 500 MG capsule Take 1 capsule (500 mg total) by mouth 2 (two) times daily. 06/19/20   Eustace Moore, MD  HYDROcodone-acetaminophen (NORCO/VICODIN) 5-325 MG tablet Take 1-2 tablets by mouth every 6 (six) hours as needed for severe pain. 06/19/20   Eustace Moore, MD  fluticasone [facility]) 50 MCG/ACT nasal spray Place 2 sprays into both nostrils daily. 11/13/19 06/19/20  Domenick Gong, MD  hydrochlorothiazide (HYDRODIURIL) 12.5 MG tablet Take 12.5 mg by mouth daily. 11/01/14 11/13/19  [provider]    Family History Family History  Problem Relation Age of Onset  . Cancer Mother   . Heart failure Father   . Atrial fibrillation Sister   . Thyroid disease Neg Hx     Social History Social History   Tobacco Use  . Smoking status: Never Smoker  . Smokeless tobacco: Never Used  Substance Use Topics  . Alcohol use: No  . Drug use: No    Types: Oxycodone     Allergies   Compazine [prochlorperazine] and Phenothiazines   Review of Systems Review of Systems See HPI  Physical Exam Triage Vital Signs ED Triage Vitals  Enc Vitals Group     BP 06/19/20 1031 (!) 141/84     Pulse Rate 06/19/20 1031 86     Resp 06/19/20 1031 20  Temp 06/19/20 1031 98.8 F (37.1 C)     Temp Source 06/19/20 1031 Oral     SpO2 06/19/20 1031 99 %     Weight --      Height --      Head Circumference --      Peak Flow --      Pain Score 06/19/20 1028 8     Pain Loc --      Pain Edu? --      Excl. in GC? --    No data found.  Updated Vital Signs BP (!) 141/84 (BP Location: Left Arm)   Pulse 86   Temp 98.8 F (37.1 C) (Oral)   Resp 20   SpO2 99%      Physical Exam Constitutional:      General: She is not in acute distress.    Appearance: She is well-developed.     Comments: Overweight.  No acute distress  HENT:     Head: Normocephalic and atraumatic.     Mouth/Throat:     Comments: Mask is in place Eyes:      Conjunctiva/sclera: Conjunctivae normal.     Pupils: Pupils are equal, round, and reactive to light.  Cardiovascular:     Rate and Rhythm: Normal rate.  Pulmonary:     Effort: Pulmonary effort is normal. No respiratory distress.  Abdominal:     General: There is no distension.     Palpations: Abdomen is soft.  Musculoskeletal:        General: Normal range of motion.     Cervical back: Normal range of motion.  Skin:    General: Skin is warm and dry.     Comments: Right foot has soft tissue swelling across the dorsum to the ankle.  Mild erythema.  Warmth.  Redness and tenderness of the great toe.  Pain with movement of any toes.  There is evidence of old surgery, deformities, a pad is in place on the dorsum of the third toe.  Some malodor noted.  No open wound seen  Neurological:     Mental Status: She is alert.     Gait: Gait abnormal.  Psychiatric:        Behavior: Behavior normal.      UC Treatments / Results  Labs (all labs ordered are listed, but only abnormal results are displayed) Labs Reviewed - No data to display  EKG   Radiology No results found.  Procedures Procedures (including critical care time)  Medications Ordered in UC Medications - No data to display  Initial Impression / Assessment and Plan / UC Course  I have reviewed the triage vital signs and the nursing notes.  Pertinent labs & imaging results that were available during my care of the patient were reviewed by me and considered in my medical decision making (see chart for details).      Final Clinical Impressions(s) / UC Diagnoses   Final diagnoses:  Cellulitis of right foot     Discharge Instructions     Take antibiotic 2 times a day Consider taking a probiotic while you are on the antibiotics to protect your stomach Take pain medication as needed.  Do not drive on the pain medicine Elevate your foot as much as you are able See your doctor if not improving in 2 to 3 days    ED  Prescriptions    Medication Sig Dispense Auth. Provider   HYDROcodone-acetaminophen (NORCO/VICODIN) 5-325 MG tablet Take 1-2 tablets by mouth every  6 (six) hours as needed for severe pain. 15 tablet Eustace Moore, MD   cefadroxil (DURICEF) 500 MG capsule Take 1 capsule (500 mg total) by mouth 2 (two) times daily. 20 capsule Eustace Moore, MD     I have reviewed the PDMP during this encounter.   Eustace Moore, MD 06/19/20 [phone]

## 2020-06-19 NOTE — ED Triage Notes (Signed)
Patient had right great toenail removed 4 weeks ago at the [facility] center.  Initially things were good, on 10/22 started having pain in ball of foot and redness and swelling to toes.

## 2020-06-21 DIAGNOSIS — L03031 Cellulitis of right toe: Secondary | ICD-10-CM | POA: Diagnosis not present

## 2020-06-21 DIAGNOSIS — M2041 Other hammer toe(s) (acquired), right foot: Secondary | ICD-10-CM | POA: Diagnosis not present

## 2020-06-21 DIAGNOSIS — L97512 Non-pressure chronic ulcer of other part of right foot with fat layer exposed: Secondary | ICD-10-CM | POA: Diagnosis not present

## 2020-06-23 DIAGNOSIS — L97512 Non-pressure chronic ulcer of other part of right foot with fat layer exposed: Secondary | ICD-10-CM | POA: Diagnosis not present

## 2020-06-23 DIAGNOSIS — L03031 Cellulitis of right toe: Secondary | ICD-10-CM | POA: Diagnosis not present

## 2020-06-23 DIAGNOSIS — M2041 Other hammer toe(s) (acquired), right foot: Secondary | ICD-10-CM | POA: Diagnosis not present

## 2020-06-24 DIAGNOSIS — R69 Illness, unspecified: Secondary | ICD-10-CM | POA: Diagnosis not present

## 2020-07-20 DIAGNOSIS — M21611 Bunion of right foot: Secondary | ICD-10-CM | POA: Diagnosis not present

## 2020-07-20 DIAGNOSIS — M2041 Other hammer toe(s) (acquired), right foot: Secondary | ICD-10-CM | POA: Diagnosis not present

## 2020-07-20 DIAGNOSIS — M79671 Pain in right foot: Secondary | ICD-10-CM | POA: Diagnosis not present

## 2020-08-15 DIAGNOSIS — M79671 Pain in right foot: Secondary | ICD-10-CM | POA: Diagnosis not present

## 2020-08-22 DIAGNOSIS — M21611 Bunion of right foot: Secondary | ICD-10-CM | POA: Diagnosis not present

## 2020-08-22 DIAGNOSIS — M2041 Other hammer toe(s) (acquired), right foot: Secondary | ICD-10-CM | POA: Diagnosis not present

## 2020-10-10 DIAGNOSIS — M21611 Bunion of right foot: Secondary | ICD-10-CM | POA: Diagnosis not present

## 2020-10-10 DIAGNOSIS — M2041 Other hammer toe(s) (acquired), right foot: Secondary | ICD-10-CM | POA: Diagnosis not present

## 2020-11-14 DIAGNOSIS — E039 Hypothyroidism, unspecified: Secondary | ICD-10-CM | POA: Diagnosis not present

## 2020-11-14 DIAGNOSIS — K219 Gastro-esophageal reflux disease without esophagitis: Secondary | ICD-10-CM | POA: Diagnosis not present

## 2020-11-14 DIAGNOSIS — M2041 Other hammer toe(s) (acquired), right foot: Secondary | ICD-10-CM | POA: Diagnosis not present

## 2020-11-14 DIAGNOSIS — B0229 Other postherpetic nervous system involvement: Secondary | ICD-10-CM | POA: Diagnosis not present

## 2020-11-14 DIAGNOSIS — F439 Reaction to severe stress, unspecified: Secondary | ICD-10-CM | POA: Diagnosis not present

## 2020-11-14 DIAGNOSIS — I10 Essential (primary) hypertension: Secondary | ICD-10-CM | POA: Diagnosis not present

## 2020-11-14 DIAGNOSIS — D696 Thrombocytopenia, unspecified: Secondary | ICD-10-CM | POA: Diagnosis not present

## 2020-11-14 DIAGNOSIS — F419 Anxiety disorder, unspecified: Secondary | ICD-10-CM | POA: Diagnosis not present

## 2020-11-14 DIAGNOSIS — D692 Other nonthrombocytopenic purpura: Secondary | ICD-10-CM | POA: Diagnosis not present

## 2020-11-14 DIAGNOSIS — E782 Mixed hyperlipidemia: Secondary | ICD-10-CM | POA: Diagnosis not present

## 2020-11-14 DIAGNOSIS — E1169 Type 2 diabetes mellitus with other specified complication: Secondary | ICD-10-CM | POA: Diagnosis not present

## 2020-11-21 DIAGNOSIS — Z1231 Encounter for screening mammogram for malignant neoplasm of breast: Secondary | ICD-10-CM | POA: Diagnosis not present

## 2020-12-07 DIAGNOSIS — R922 Inconclusive mammogram: Secondary | ICD-10-CM | POA: Diagnosis not present

## 2020-12-07 DIAGNOSIS — R928 Other abnormal and inconclusive findings on diagnostic imaging of breast: Secondary | ICD-10-CM | POA: Diagnosis not present

## 2020-12-09 DIAGNOSIS — M21611 Bunion of right foot: Secondary | ICD-10-CM | POA: Diagnosis not present

## 2020-12-09 DIAGNOSIS — M2041 Other hammer toe(s) (acquired), right foot: Secondary | ICD-10-CM | POA: Diagnosis not present

## 2020-12-20 DIAGNOSIS — M2041 Other hammer toe(s) (acquired), right foot: Secondary | ICD-10-CM | POA: Diagnosis not present

## 2021-03-29 DIAGNOSIS — Z20822 Contact with and (suspected) exposure to covid-19: Secondary | ICD-10-CM | POA: Diagnosis not present

## 2021-03-29 DIAGNOSIS — R059 Cough, unspecified: Secondary | ICD-10-CM | POA: Diagnosis not present

## 2021-04-19 DIAGNOSIS — H524 Presbyopia: Secondary | ICD-10-CM | POA: Diagnosis not present

## 2021-04-19 DIAGNOSIS — H5211 Myopia, right eye: Secondary | ICD-10-CM | POA: Diagnosis not present

## 2021-04-19 DIAGNOSIS — H2513 Age-related nuclear cataract, bilateral: Secondary | ICD-10-CM | POA: Diagnosis not present

## 2021-04-19 DIAGNOSIS — H52203 Unspecified astigmatism, bilateral: Secondary | ICD-10-CM | POA: Diagnosis not present

## 2021-04-19 DIAGNOSIS — H25013 Cortical age-related cataract, bilateral: Secondary | ICD-10-CM | POA: Diagnosis not present

## 2021-04-24 DIAGNOSIS — Z89421 Acquired absence of other right toe(s): Secondary | ICD-10-CM | POA: Diagnosis not present

## 2021-06-09 DIAGNOSIS — E039 Hypothyroidism, unspecified: Secondary | ICD-10-CM | POA: Diagnosis not present

## 2021-06-09 DIAGNOSIS — Z1389 Encounter for screening for other disorder: Secondary | ICD-10-CM | POA: Diagnosis not present

## 2021-06-09 DIAGNOSIS — Z23 Encounter for immunization: Secondary | ICD-10-CM | POA: Diagnosis not present

## 2021-06-09 DIAGNOSIS — R053 Chronic cough: Secondary | ICD-10-CM | POA: Diagnosis not present

## 2021-06-09 DIAGNOSIS — K219 Gastro-esophageal reflux disease without esophagitis: Secondary | ICD-10-CM | POA: Diagnosis not present

## 2021-06-09 DIAGNOSIS — E1169 Type 2 diabetes mellitus with other specified complication: Secondary | ICD-10-CM | POA: Diagnosis not present

## 2021-06-09 DIAGNOSIS — E782 Mixed hyperlipidemia: Secondary | ICD-10-CM | POA: Diagnosis not present

## 2021-06-09 DIAGNOSIS — Z0000 Encounter for general adult medical examination without abnormal findings: Secondary | ICD-10-CM | POA: Diagnosis not present

## 2021-06-09 DIAGNOSIS — D696 Thrombocytopenia, unspecified: Secondary | ICD-10-CM | POA: Diagnosis not present

## 2021-06-09 DIAGNOSIS — I10 Essential (primary) hypertension: Secondary | ICD-10-CM | POA: Diagnosis not present

## 2021-06-14 DIAGNOSIS — Z1211 Encounter for screening for malignant neoplasm of colon: Secondary | ICD-10-CM | POA: Diagnosis not present

## 2021-07-26 DIAGNOSIS — H1033 Unspecified acute conjunctivitis, bilateral: Secondary | ICD-10-CM | POA: Diagnosis not present

## 2021-07-31 DIAGNOSIS — H1033 Unspecified acute conjunctivitis, bilateral: Secondary | ICD-10-CM | POA: Diagnosis not present

## 2021-08-01 ENCOUNTER — Ambulatory Visit (HOSPITAL_COMMUNITY)
Admission: EM | Admit: 2021-08-01 | Discharge: 2021-08-01 | Disposition: A | Discharge status: Home / Self Care | Payer: Medicare HMO | Attending: Emergency Medicine | Admitting: Emergency Medicine

## 2021-08-01 ENCOUNTER — Ambulatory Visit (INDEPENDENT_AMBULATORY_CARE_PROVIDER_SITE_OTHER): Payer: Medicare HMO

## 2021-08-01 ENCOUNTER — Other Ambulatory Visit: Payer: Self-pay

## 2021-08-01 ENCOUNTER — Encounter (HOSPITAL_COMMUNITY): Payer: Self-pay

## 2021-08-01 DIAGNOSIS — J40 Bronchitis, not specified as acute or chronic: Secondary | ICD-10-CM | POA: Diagnosis not present

## 2021-08-01 DIAGNOSIS — R059 Cough, unspecified: Secondary | ICD-10-CM

## 2021-08-01 MED ORDER — HYDROCOD POLST-CPM POLST ER 10-8 MG/5ML PO SUER: 5.0000 mL | Freq: Two times a day (BID) | ORAL | 0 refills | Status: AC | PRN

## 2021-08-01 MED ORDER — BENZONATATE 100 MG PO CAPS: 100.0000 mg | ORAL_CAPSULE | Freq: Three times a day (TID) | ORAL | 0 refills | Status: AC

## 2021-08-01 MED ORDER — PREDNISONE 50 MG PO TABS: 50.0000 mg | ORAL_TABLET | Freq: Every day | ORAL | 0 refills | Status: AC

## 2021-08-01 NOTE — ED Provider Notes (Signed)
[name]  ____________________________________________  Time seen: Approximately 4:41 PM  I have reviewed the triage vital signs and the nursing notes.   HISTORY  Chief Complaint Cough    HPI Diamond White is a 80 y.o. female who presents to the urgent care for complaint of 3 weeks of cough.  Patient has had some mild nasal congestion but no other symptoms to include fever, sore throat, shortness of breath, chest pain, GI symptoms.  Patient was originally called in some cough medication from her primary care provider but states that she is completely used this cough medication at this time.  Patient still has a cough.  She does not feel "poorly" but just "cannot get rid of this cough."       Past Medical History:  Diagnosis Date   Arthritis    Hyperlipidemia    Hypertension    PONV (postoperative nausea and vomiting)     Patient Active Problem List   Diagnosis Date Noted   S/P total knee replacement 03/10/2015   Benign essential HTN 03/10/2015   Hyperlipidemia 03/10/2015   PONV (postoperative nausea and vomiting) 03/10/2015   Primary osteoarthritis of right knee 12/24/2014    Past Surgical History:  Procedure Laterality Date   ABDOMINAL HYSTERECTOMY     APPENDECTOMY     REPLACEMENT TOTAL KNEE     TONSILLECTOMY     TOTAL KNEE ARTHROPLASTY Right 12/24/2014   Procedure: TOTAL KNEE ARTHROPLASTY;  Surgeon: Jodi Geralds, MD;  Location: [facility];  Service: Orthopedics;  Laterality: Right;    Prior to Admission medications   Medication Sig Start Date End Date Taking? Authorizing Provider  benzonatate (TESSALON) 100 MG capsule Take 1 capsule (100 mg total) by mouth every 8 (eight) hours. 08/01/21  Yes Jemya Depierro, Delorise Royals, PA-C  chlorpheniramine-HYDROcodone (TUSSIONEX PENNKINETIC ER) 10-8 MG/5ML SUER Take 5 mLs by mouth every 12 (twelve) hours as needed for cough. 08/01/21  Yes Roy Tokarz, Delorise Royals, PA-C  losartan (COZAAR) 50 MG tablet Take 50 mg by mouth  daily. 09/24/19  Yes [provider]  Multiple Vitamins-Minerals (PRESERVISION/LUTEIN PO) Take by mouth.   Yes [provider]  predniSONE (DELTASONE) 50 MG tablet Take 1 tablet (50 mg total) by mouth daily with breakfast. 08/01/21  Yes Kehlani Vancamp, Delorise Royals, PA-C  simvastatin (ZOCOR) 20 MG tablet Take 20 mg by mouth every evening. 09/30/14  Yes [provider]  cefadroxil (DURICEF) 500 MG capsule Take 1 capsule (500 mg total) by mouth 2 (two) times daily. 06/19/20   Eustace Moore, MD  HYDROcodone-acetaminophen (NORCO/VICODIN) 5-325 MG tablet Take 1-2 tablets by mouth every 6 (six) hours as needed for severe pain. 06/19/20   Eustace Moore, MD  terbinafine (LAMISIL) 250 MG tablet Take 250 mg by mouth daily. 06/13/20   [provider]  fluticasone (FLONASE) 50 MCG/ACT nasal spray Place 2 sprays into both nostrils daily. 11/13/19 06/19/20  Domenick Gong, MD  hydrochlorothiazide (HYDRODIURIL) 12.5 MG tablet Take 12.5 mg by mouth daily. 11/01/14 11/13/19  [provider]    Allergies Compazine [prochlorperazine] and Phenothiazines  Family History  Problem Relation Age of Onset   Cancer Mother    Heart failure Father    Atrial fibrillation Sister    Thyroid disease Neg Hx     Social History Social History   Tobacco Use   Smoking status: Never   Smokeless tobacco: Never  Substance Use Topics   Alcohol use: No   Drug use: No    Types:  Oxycodone     Review of Systems  Constitutional: No fever/chills Eyes: No visual changes. No discharge ENT: No upper respiratory complaints. Cardiovascular: no chest pain. Respiratory: 3 weeks nonproductive cough. No SOB. Gastrointestinal: No abdominal pain.  No nausea, no vomiting.  No diarrhea.  No constipation. Musculoskeletal: Negative for musculoskeletal pain. Skin: Negative for rash, abrasions, lacerations, ecchymosis. Neurological: Negative for headaches, focal weakness or numbness.  10  System ROS otherwise negative.  ____________________________________________   PHYSICAL EXAM:  VITAL SIGNS: ED Triage Vitals  Enc Vitals Group     BP 08/01/21 1633 (!) 141/93     Pulse Rate 08/01/21 1633 87     Resp 08/01/21 1633 18     Temp 08/01/21 1633 98.8 F (37.1 C)     Temp Source 08/01/21 1633 Oral     SpO2 08/01/21 1633 96 %     Weight 08/01/21 1630 225 lb (102.1 kg)     Height 08/01/21 1630 5\' 8"  (1.727 m)     Head Circumference --      Peak Flow --      Pain Score 08/01/21 1630 0     Pain Loc --      Pain Edu? --      Excl. in GC? --      Constitutional: Alert and oriented. Well appearing and in no acute distress. Eyes: Conjunctivae are normal. PERRL. EOMI. Head: Atraumatic. ENT:      Ears:       Nose: No congestion/rhinnorhea.      Mouth/Throat: Mucous membranes are moist.  Neck: No stridor.    Cardiovascular: Normal rate, regular rhythm. Normal S1 and S2.  Good peripheral circulation. Respiratory: Normal respiratory effort without tachypnea or retractions. Lungs CTAB. Good air entry to the bases with no decreased or absent breath sounds. Musculoskeletal: Full range of motion to all extremities. No gross deformities appreciated. Neurologic:  Normal speech and language. No gross focal neurologic deficits are appreciated.  Skin:  Skin is warm, dry and intact. No rash noted. Psychiatric: Mood and affect are normal. Speech and behavior are normal. Patient exhibits appropriate insight and judgement.   ____________________________________________   LABS (all labs ordered are listed, but only abnormal results are displayed)  Labs Reviewed - No data to display ____________________________________________  EKG   ____________________________________________  RADIOLOGY I personally viewed and evaluated these images as part of my medical decision making, as well as reviewing the written report by the radiologist.  ED Provider Interpretation: No acute  consolidation concerning for pneumonia  DG Chest 2 View  Result Date: 08/01/2021 CLINICAL DATA:  Cough. EXAM: CHEST - 2 VIEW COMPARISON:  Chest radiograph dated 11/13/2019. FINDINGS: The heart size and mediastinal contours are within normal limits. Both lungs are clear. The visualized skeletal structures are unremarkable. IMPRESSION: No active cardiopulmonary disease. Electronically Signed   By: 11/15/2019 M.D.   On: 08/01/2021 17:23    ____________________________________________    PROCEDURES  Procedure(s) performed:    Procedures    Medications - No data to display   ____________________________________________   INITIAL IMPRESSION / ASSESSMENT AND PLAN / ED COURSE  Pertinent labs & imaging results that were available during my care of the patient were reviewed by me and considered in my medical decision making (see chart for details).  Review of the [place] CSRS was performed in accordance of the [facility] prior to dispensing any controlled drugs.           Patient's diagnosis is consistent with bronchitis.  Patient presents emergency department with cough.  No associated symptoms other than some mild nasal congestion.  No fevers.  No difficulty breathing.  No chest pain.  Chest x-ray revealed no areas of consolidation concerning for pneumonia.  Patient will have prednisone, cough medications for symptom relief.  Follow-up with primary care as needed..  Patient is given ED precautions to return to the ED for any worsening or new symptoms.     ____________________________________________  FINAL CLINICAL IMPRESSION(S) / DIAGNOSES  Final diagnoses:  Bronchitis      NEW MEDICATIONS STARTED DURING THIS VISIT:  ED Discharge Orders          Ordered    chlorpheniramine-HYDROcodone (TUSSIONEX PENNKINETIC ER) 10-8 MG/5ML SUER  Every 12 hours PRN        08/01/21 1735    predniSONE (DELTASONE) 50 MG tablet  Daily with breakfast        08/01/21 1735    benzonatate  (TESSALON) 100 MG capsule  Every 8 hours        08/01/21 1735                This chart was dictated using voice recognition software/Dragon. Despite best efforts to proofread, errors can occur which can change the meaning. Any change was purely unintentional.    Racheal Patches, PA-C 08/01/21 1736

## 2021-08-01 NOTE — ED Triage Notes (Signed)
Pt c/o cough x2-3weeks. Pt was given hydrocodone cough medication by PCP but the medication bottle was half empty. Pt is out of the medication currently.

## 2021-09-24 DIAGNOSIS — Z20822 Contact with and (suspected) exposure to covid-19: Secondary | ICD-10-CM | POA: Diagnosis not present

## 2021-09-25 DIAGNOSIS — M79672 Pain in left foot: Secondary | ICD-10-CM | POA: Diagnosis not present

## 2021-09-25 DIAGNOSIS — M79671 Pain in right foot: Secondary | ICD-10-CM | POA: Diagnosis not present

## 2021-09-25 DIAGNOSIS — M19079 Primary osteoarthritis, unspecified ankle and foot: Secondary | ICD-10-CM | POA: Diagnosis not present

## 2021-11-27 DIAGNOSIS — Z1231 Encounter for screening mammogram for malignant neoplasm of breast: Secondary | ICD-10-CM | POA: Diagnosis not present

## 2021-11-29 DIAGNOSIS — R051 Acute cough: Secondary | ICD-10-CM | POA: Diagnosis not present

## 2021-11-29 DIAGNOSIS — Z03818 Encounter for observation for suspected exposure to other biological agents ruled out: Secondary | ICD-10-CM | POA: Diagnosis not present

## 2021-11-29 DIAGNOSIS — J069 Acute upper respiratory infection, unspecified: Secondary | ICD-10-CM | POA: Diagnosis not present

## 2021-12-12 DIAGNOSIS — Z13820 Encounter for screening for osteoporosis: Secondary | ICD-10-CM | POA: Diagnosis not present

## 2021-12-19 DIAGNOSIS — E1169 Type 2 diabetes mellitus with other specified complication: Secondary | ICD-10-CM | POA: Diagnosis not present

## 2021-12-19 DIAGNOSIS — Z23 Encounter for immunization: Secondary | ICD-10-CM | POA: Diagnosis not present

## 2021-12-19 DIAGNOSIS — I10 Essential (primary) hypertension: Secondary | ICD-10-CM | POA: Diagnosis not present

## 2021-12-19 DIAGNOSIS — L304 Erythema intertrigo: Secondary | ICD-10-CM | POA: Diagnosis not present

## 2021-12-19 DIAGNOSIS — Z7185 Encounter for immunization safety counseling: Secondary | ICD-10-CM | POA: Diagnosis not present

## 2021-12-19 DIAGNOSIS — E782 Mixed hyperlipidemia: Secondary | ICD-10-CM | POA: Diagnosis not present

## 2023-09-10 DIAGNOSIS — Z0000 Encounter for general adult medical examination without abnormal findings: Secondary | ICD-10-CM | POA: Diagnosis not present

## 2023-09-10 DIAGNOSIS — E2839 Other primary ovarian failure: Secondary | ICD-10-CM | POA: Diagnosis not present

## 2023-09-10 DIAGNOSIS — I10 Essential (primary) hypertension: Secondary | ICD-10-CM | POA: Diagnosis not present

## 2023-09-10 DIAGNOSIS — E039 Hypothyroidism, unspecified: Secondary | ICD-10-CM | POA: Diagnosis not present

## 2023-09-10 DIAGNOSIS — E1136 Type 2 diabetes mellitus with diabetic cataract: Secondary | ICD-10-CM | POA: Diagnosis not present

## 2023-09-10 DIAGNOSIS — L853 Xerosis cutis: Secondary | ICD-10-CM | POA: Diagnosis not present

## 2023-09-10 DIAGNOSIS — L608 Other nail disorders: Secondary | ICD-10-CM | POA: Diagnosis not present

## 2023-09-10 DIAGNOSIS — E782 Mixed hyperlipidemia: Secondary | ICD-10-CM | POA: Diagnosis not present

## 2023-10-30 DIAGNOSIS — K219 Gastro-esophageal reflux disease without esophagitis: Secondary | ICD-10-CM | POA: Diagnosis not present

## 2023-10-30 DIAGNOSIS — Z8249 Family history of ischemic heart disease and other diseases of the circulatory system: Secondary | ICD-10-CM | POA: Diagnosis not present

## 2023-10-30 DIAGNOSIS — E669 Obesity, unspecified: Secondary | ICD-10-CM | POA: Diagnosis not present

## 2023-10-30 DIAGNOSIS — E1136 Type 2 diabetes mellitus with diabetic cataract: Secondary | ICD-10-CM | POA: Diagnosis not present

## 2023-10-30 DIAGNOSIS — Z888 Allergy status to other drugs, medicaments and biological substances status: Secondary | ICD-10-CM | POA: Diagnosis not present

## 2023-10-30 DIAGNOSIS — M4800 Spinal stenosis, site unspecified: Secondary | ICD-10-CM | POA: Diagnosis not present

## 2023-10-30 DIAGNOSIS — M419 Scoliosis, unspecified: Secondary | ICD-10-CM | POA: Diagnosis not present

## 2023-10-30 DIAGNOSIS — E785 Hyperlipidemia, unspecified: Secondary | ICD-10-CM | POA: Diagnosis not present

## 2023-10-30 DIAGNOSIS — Z79890 Hormone replacement therapy (postmenopausal): Secondary | ICD-10-CM | POA: Diagnosis not present

## 2023-10-30 DIAGNOSIS — M2040 Other hammer toe(s) (acquired), unspecified foot: Secondary | ICD-10-CM | POA: Diagnosis not present

## 2023-10-30 DIAGNOSIS — I10 Essential (primary) hypertension: Secondary | ICD-10-CM | POA: Diagnosis not present

## 2023-10-30 DIAGNOSIS — M1990 Unspecified osteoarthritis, unspecified site: Secondary | ICD-10-CM | POA: Diagnosis not present

## 2023-10-30 DIAGNOSIS — F419 Anxiety disorder, unspecified: Secondary | ICD-10-CM | POA: Diagnosis not present

## 2023-10-30 DIAGNOSIS — Z87892 Personal history of anaphylaxis: Secondary | ICD-10-CM | POA: Diagnosis not present

## 2023-10-30 DIAGNOSIS — Z6833 Body mass index (BMI) 33.0-33.9, adult: Secondary | ICD-10-CM | POA: Diagnosis not present

## 2023-10-30 DIAGNOSIS — Z833 Family history of diabetes mellitus: Secondary | ICD-10-CM | POA: Diagnosis not present

## 2023-10-30 DIAGNOSIS — E039 Hypothyroidism, unspecified: Secondary | ICD-10-CM | POA: Diagnosis not present

## 2023-10-30 DIAGNOSIS — M545 Low back pain, unspecified: Secondary | ICD-10-CM | POA: Diagnosis not present

## 2023-11-11 DIAGNOSIS — R69 Illness, unspecified: Secondary | ICD-10-CM | POA: Diagnosis not present

## 2023-11-29 DIAGNOSIS — M150 Primary generalized (osteo)arthritis: Secondary | ICD-10-CM | POA: Diagnosis not present

## 2023-11-29 DIAGNOSIS — M545 Low back pain, unspecified: Secondary | ICD-10-CM | POA: Diagnosis not present

## 2023-11-29 DIAGNOSIS — E039 Hypothyroidism, unspecified: Secondary | ICD-10-CM | POA: Diagnosis not present

## 2023-12-16 DIAGNOSIS — E2839 Other primary ovarian failure: Secondary | ICD-10-CM | POA: Diagnosis not present

## 2023-12-16 DIAGNOSIS — N958 Other specified menopausal and perimenopausal disorders: Secondary | ICD-10-CM | POA: Diagnosis not present

## 2023-12-16 DIAGNOSIS — Z1231 Encounter for screening mammogram for malignant neoplasm of breast: Secondary | ICD-10-CM | POA: Diagnosis not present

## 2023-12-31 DIAGNOSIS — I10 Essential (primary) hypertension: Secondary | ICD-10-CM | POA: Diagnosis not present

## 2023-12-31 DIAGNOSIS — E782 Mixed hyperlipidemia: Secondary | ICD-10-CM | POA: Diagnosis not present

## 2023-12-31 DIAGNOSIS — E1136 Type 2 diabetes mellitus with diabetic cataract: Secondary | ICD-10-CM | POA: Diagnosis not present

## 2024-01-02 DIAGNOSIS — E039 Hypothyroidism, unspecified: Secondary | ICD-10-CM | POA: Diagnosis not present

## 2024-01-02 DIAGNOSIS — N3281 Overactive bladder: Secondary | ICD-10-CM | POA: Diagnosis not present

## 2024-01-02 DIAGNOSIS — E782 Mixed hyperlipidemia: Secondary | ICD-10-CM | POA: Diagnosis not present

## 2024-01-02 DIAGNOSIS — I10 Essential (primary) hypertension: Secondary | ICD-10-CM | POA: Diagnosis not present

## 2024-01-02 DIAGNOSIS — R350 Frequency of micturition: Secondary | ICD-10-CM | POA: Diagnosis not present

## 2024-01-02 DIAGNOSIS — E118 Type 2 diabetes mellitus with unspecified complications: Secondary | ICD-10-CM | POA: Diagnosis not present

## 2024-01-29 DIAGNOSIS — N949 Unspecified condition associated with female genital organs and menstrual cycle: Secondary | ICD-10-CM | POA: Diagnosis not present

## 2024-01-29 DIAGNOSIS — N898 Other specified noninflammatory disorders of vagina: Secondary | ICD-10-CM | POA: Diagnosis not present

## 2024-02-16 DIAGNOSIS — L02213 Cutaneous abscess of chest wall: Secondary | ICD-10-CM | POA: Diagnosis not present

## 2024-02-18 DIAGNOSIS — L02213 Cutaneous abscess of chest wall: Secondary | ICD-10-CM | POA: Diagnosis not present

## 2024-02-21 DIAGNOSIS — L0291 Cutaneous abscess, unspecified: Secondary | ICD-10-CM | POA: Diagnosis not present

## 2024-02-26 DIAGNOSIS — B372 Candidiasis of skin and nail: Secondary | ICD-10-CM | POA: Diagnosis not present

## 2024-02-26 DIAGNOSIS — L0291 Cutaneous abscess, unspecified: Secondary | ICD-10-CM | POA: Diagnosis not present

## 2024-03-12 DIAGNOSIS — E119 Type 2 diabetes mellitus without complications: Secondary | ICD-10-CM | POA: Diagnosis not present

## 2024-03-12 DIAGNOSIS — H2513 Age-related nuclear cataract, bilateral: Secondary | ICD-10-CM | POA: Diagnosis not present

## 2024-04-03 DIAGNOSIS — E039 Hypothyroidism, unspecified: Secondary | ICD-10-CM | POA: Diagnosis not present

## 2024-04-03 DIAGNOSIS — K047 Periapical abscess without sinus: Secondary | ICD-10-CM | POA: Diagnosis not present

## 2024-04-03 DIAGNOSIS — B372 Candidiasis of skin and nail: Secondary | ICD-10-CM | POA: Diagnosis not present

## 2024-04-03 DIAGNOSIS — I10 Essential (primary) hypertension: Secondary | ICD-10-CM | POA: Diagnosis not present

## 2024-04-03 DIAGNOSIS — E782 Mixed hyperlipidemia: Secondary | ICD-10-CM | POA: Diagnosis not present

## 2024-04-03 DIAGNOSIS — E1136 Type 2 diabetes mellitus with diabetic cataract: Secondary | ICD-10-CM | POA: Diagnosis not present

## 2024-04-21 DIAGNOSIS — M79644 Pain in right finger(s): Secondary | ICD-10-CM | POA: Diagnosis not present

## 2024-05-28 DIAGNOSIS — M65331 Trigger finger, right middle finger: Secondary | ICD-10-CM | POA: Diagnosis not present

## 2024-06-26 DIAGNOSIS — M65331 Trigger finger, right middle finger: Secondary | ICD-10-CM | POA: Diagnosis not present

## 2024-07-17 DIAGNOSIS — M65331 Trigger finger, right middle finger: Secondary | ICD-10-CM | POA: Diagnosis not present

## 2024-07-31 DIAGNOSIS — J069 Acute upper respiratory infection, unspecified: Secondary | ICD-10-CM | POA: Diagnosis not present

## 2024-07-31 DIAGNOSIS — H6123 Impacted cerumen, bilateral: Secondary | ICD-10-CM | POA: Diagnosis not present
